# Patient Record
Sex: Female | Born: 1990 | Hispanic: No | Marital: Single | Smoking: Never smoker
Health system: Southern US, Community
[De-identification: ages and names within clinical notes are randomized; demographics above are authoritative.]

## PROBLEM LIST (undated history)

## (undated) ENCOUNTER — Inpatient Hospital Stay (HOSPITAL_COMMUNITY): Payer: Self-pay

## (undated) DIAGNOSIS — Z789 Other specified health status: Secondary | ICD-10-CM

## (undated) DIAGNOSIS — E669 Obesity, unspecified: Secondary | ICD-10-CM

## (undated) HISTORY — PX: NO PAST SURGERIES: SHX2092

---

## 2014-12-25 ENCOUNTER — Emergency Department (HOSPITAL_COMMUNITY)
Admission: EM | Admit: 2014-12-25 | Discharge: 2014-12-25 | Disposition: A | Discharge status: Home / Self Care | Payer: 59 | Attending: Emergency Medicine | Admitting: Emergency Medicine

## 2014-12-25 ENCOUNTER — Emergency Department (HOSPITAL_COMMUNITY): Payer: 59

## 2014-12-25 ENCOUNTER — Encounter (HOSPITAL_COMMUNITY): Payer: Self-pay | Admitting: Emergency Medicine

## 2014-12-25 DIAGNOSIS — L089 Local infection of the skin and subcutaneous tissue, unspecified: Secondary | ICD-10-CM | POA: Diagnosis not present

## 2014-12-25 DIAGNOSIS — Y9289 Other specified places as the place of occurrence of the external cause: Secondary | ICD-10-CM | POA: Insufficient documentation

## 2014-12-25 DIAGNOSIS — Y9389 Activity, other specified: Secondary | ICD-10-CM | POA: Insufficient documentation

## 2014-12-25 DIAGNOSIS — W228XXA Striking against or struck by other objects, initial encounter: Secondary | ICD-10-CM | POA: Insufficient documentation

## 2014-12-25 DIAGNOSIS — Y998 Other external cause status: Secondary | ICD-10-CM | POA: Diagnosis not present

## 2014-12-25 DIAGNOSIS — Z23 Encounter for immunization: Secondary | ICD-10-CM | POA: Diagnosis not present

## 2014-12-25 DIAGNOSIS — S90451A Superficial foreign body, right great toe, initial encounter: Secondary | ICD-10-CM | POA: Insufficient documentation

## 2014-12-25 DIAGNOSIS — S99921A Unspecified injury of right foot, initial encounter: Secondary | ICD-10-CM | POA: Diagnosis present

## 2014-12-25 MED ORDER — HYDROCODONE-ACETAMINOPHEN 5-325 MG PO TABS: 1.0000 | ORAL_TABLET | Freq: Four times a day (QID) | ORAL | Status: DC | PRN

## 2014-12-25 MED ORDER — LIDOCAINE HCL 2 % IJ SOLN
20.0000 mL | Freq: Once | INTRAMUSCULAR | Status: AC
  Administered 2014-12-25: 400 mg via INTRADERMAL
  Filled 2014-12-25: qty 20

## 2014-12-25 MED ORDER — CEPHALEXIN 250 MG PO CAPS
1000.0000 mg | ORAL_CAPSULE | Freq: Once | ORAL | Status: AC
  Administered 2014-12-25: 1000 mg via ORAL
  Filled 2014-12-25: qty 4

## 2014-12-25 MED ORDER — CEPHALEXIN 500 MG PO CAPS: 1000.0000 mg | ORAL_CAPSULE | Freq: Two times a day (BID) | ORAL | Status: DC

## 2014-12-25 MED ORDER — TETANUS-DIPHTH-ACELL PERTUSSIS 5-2.5-18.5 LF-MCG/0.5 IM SUSP
0.5000 mL | Freq: Once | INTRAMUSCULAR | Status: AC
  Administered 2014-12-25: 0.5 mL via INTRAMUSCULAR
  Filled 2014-12-25: qty 0.5

## 2014-12-25 NOTE — ED Provider Notes (Signed)
CSN: [id number]     Arrival date & time 12/25/14  1158 History   First MD Initiated Contact with Patient 12/25/14 1323     Chief Complaint  Patient presents with  . Foot Injury     (Consider location/radiation/quality/duration/timing/severity/associated sxs/prior Treatment) HPI   24 year old female presents for evaluation of right foot injury. Patient reported she moved into a new apartment near week ago, certainly kicked her R great toe against a hard object in which her toe was bent forward. Since then she has endorsed sharp throbbing, non radiating 8 out of 10 pain to her great toe. She has been taking ibuprofen with some improvement. She was unable to come for evaluation due to starting a new job and can't miss any days. She denies any other injury. She denies any numbness.  History reviewed. No pertinent past medical history. No past surgical history on file. History reviewed. No pertinent family history. Social History  Substance Use Topics  . Smoking status: None  . Smokeless tobacco: None  . Alcohol Use: None   OB History    No data available     Review of Systems  Constitutional: Negative for fever.  Musculoskeletal: Positive for arthralgias.  Neurological: Negative for numbness.      Allergies  Review of patient's allergies indicates no known allergies.  Home Medications   Prior to Admission medications   Not on File   BP 129/75 mmHg  Pulse 93  Temp(Src) 98.2 F (36.8 C) (Oral)  Resp 18  Ht  (1.702 m)  Wt 230 lb (104.327 kg)  BMI 36.01 kg/m2  SpO2 97%  LMP 12/21/2014 Physical Exam  Constitutional: She appears well-developed and well-nourished. No distress.  HENT:  Head: Atraumatic.  Eyes: Conjunctivae are normal.  Neck: Neck supple.  Musculoskeletal: She exhibits tenderness (Right foot: Tenderness along the entire great toe on palpation. No bruising, swelling, or deformity noted. No nail involvement. Brisk cap refill.).  Neurological: She is  alert.  Skin: No rash noted.  Psychiatric: She has a normal mood and affect.  Nursing note and vitals reviewed.   ED Course  FOREIGN BODY REMOVAL Date/Time: 12/25/2014 3:12 PM Performed by: Fayrene Helper Authorized by: Fayrene Helper Consent: Verbal consent obtained. Risks and benefits: risks, benefits and alternatives were discussed Consent given by: patient Patient understanding: patient states understanding of the procedure being performed Patient consent: the patient's understanding of the procedure matches consent given Procedure consent: procedure consent matches procedure scheduled Imaging studies: imaging studies available Required items: required blood products, implants, devices, and special equipment available Patient identity confirmed: verbally with patient and arm band Time out: Immediately prior to procedure a "time out" was called to verify the correct patient, procedure, equipment, support staff and site/side marked as required. Intake: R great toe. Anesthesia: digital block Local anesthetic: lidocaine 2% without epinephrine Anesthetic total: 8 ml Patient sedated: no Patient restrained: no Patient cooperative: yes Complexity: complex 1 objects recovered. Objects recovered: metal needle 19mm Post-procedure assessment: foreign body removed Patient tolerance: Patient tolerated the procedure well with no immediate complications Comments: R great toe was digitally nerve blocked.  A 5mm incision made to medial pad of toe and advance approximately 1cm, meeting resistance of the fb.  A sterile hemostat as used to advance toward the fb needle and needle was manually extracted.  Pustular discharge along with dark blood were noted.  Wound was thoroughly irrigated and packing placed.  Dressing applied.       (including critical care time)  Mechanical injury of right great toe. X-ray obtained.  2:26 PM Xray demonstrates a 19mm fb noted embedded in R great toe consistent with a  needle.  I discuss this finding with Dr. Rosalia Hammers who recommend attempt removal.  If unsuccessful, then request ortho for further management.  Pt agrees with plan.   3:11 PM sucessfully removal of fb in R great toe by me.  Pt will be placed on abx, pain meds, and close f/u for wound recheck     Labs Review Labs Reviewed - No data to display  Imaging Review Dg Foot Complete Right  12/25/2014   CLINICAL DATA:  24 year old female with a history of pain and swelling right great toe.  EXAM: RIGHT FOOT COMPLETE - 3+ VIEW  COMPARISON:  None.  FINDINGS: Thin radiopaque foreign body projects within the right great toe measuring 19 mm. The foreign body projects over the distal phalanx on 3 projections, including the lateral view where it overlaps the plantar cortex of the distal phalanx.  No degenerative changes of the foot.  No displaced fracture.  Mild swelling of the great toe.  IMPRESSION: Radiopaque foreign body measuring 19 mm within the right great toe. The foreign body projects over the distal phalanx on all 3 projections, and bony penetration cannot be excluded on these views.  Signed,  Yvone Neu. Loreta Ave, DO  [facility]  [facility]   Electronically Signed   By: Gilmer Mor D.O.   On: 12/25/2014 13:40   I have personally reviewed and evaluated these images and lab results as part of my medical decision-making.   EKG Interpretation None      MDM   Final diagnoses:  Foreign body of great toe, right, infected, initial encounter    BP 129/75 mmHg  Pulse 93  Temp(Src) 98.2 F (36.8 C) (Oral)  Resp 18  Ht  (1.702 m)  Wt 230 lb (104.327 kg)  BMI 36.01 kg/m2  SpO2 97%  LMP 12/21/2014     Fayrene Helper, PA-C 12/25/14 1519  Margarita Grizzle, MD 01/03/15 1345

## 2014-12-25 NOTE — ED Notes (Signed)
Hit left foot on furniture while moving 2 days ago. Endorses pain to Right great toe. NAD

## 2014-12-25 NOTE — ED Notes (Signed)
Declined W/C at D/C and was escorted to lobby by RN. 

## 2014-12-25 NOTE — Discharge Instructions (Signed)
Take antibiotic as prescribed for the full duration.  Soak toe in water with dial antibacterial soap several times daily.  Packing can be remove in 2 days.  If worsening pain, redness, or increase pus drainage then return to ER for further care. Sliver Removal You have had a sliver (splinter) removed. This has caused a wound that extends through some or all layers of the skin and possibly into the subcutaneous tissue. This is the tissue just beneath the skin. Because these wounds can not be cleaned well, it is necessary to watch closely for infection. AFTER THE PROCEDURE  If a cut (incision) was necessary to remove this, it may have been repaired for you by your caregiver either with suturing, stapling, or adhesive strips. These keep together the skin edges and allow better and faster healing. HOME CARE INSTRUCTIONS   A dressing may have been applied. This may be changed once per day or as instructed. If the dressing sticks, it may be soaked off with a gauze pad or clean cloth that has been dampened with soapy water or hydrogen peroxide.  It is difficult to remove all slivers or foreign bodies as they may break or splinter into smaller pieces. Be aware that your body will work to remove the foreign substance. That is, the foreign body may work itself out of the wound. That is normal.  Watch for signs of infection and notify your caregiver if you suspect a sliver or foreign body remains in the wound.  You may have received a recommendation to follow up with your physician or a specialist. It is very important to call for or keep follow-up appointments in order to avoid infection or other complications.  Only take over-the-counter or prescription medicines for pain, discomfort, or fever as directed by your caregiver.  If antibiotics were prescribed, be sure to finish all of the medicine. If you did not receive a tetanus shot today because you did not recall when your last one was given, check with  your caregiver in the next day or two during follow up to determine if one is needed. SEEK MEDICAL CARE IF:   The area around the wound has new or worsening redness or tenderness.  Pus is coming from the wound  There is a foul smell from the wound or dressing  The edges of a wound that had been repaired break open SEEK IMMEDIATE MEDICAL CARE IF:   Red streaks are coming from the wound  An unexplained oral temperature above 102 F (38.9 C) develops. Document Released: 03/22/2000 Document Revised: 06/17/2011 Document Reviewed: 11/09/2007 [facility] Patient Information 2015 [place], [place]. This information is not intended to replace advice given to you by your health care provider. Make sure you discuss any questions you have with your health care provider.

## 2015-02-19 ENCOUNTER — Encounter (HOSPITAL_COMMUNITY): Payer: Self-pay

## 2015-02-19 ENCOUNTER — Inpatient Hospital Stay (HOSPITAL_COMMUNITY)
Admission: AD | Admit: 2015-02-19 | Discharge: 2015-02-19 | Disposition: A | Discharge status: Home / Self Care | Payer: 59 | Source: Ambulatory Visit | Attending: Obstetrics & Gynecology | Admitting: Obstetrics & Gynecology

## 2015-02-19 DIAGNOSIS — N912 Amenorrhea, unspecified: Secondary | ICD-10-CM | POA: Diagnosis not present

## 2015-02-19 DIAGNOSIS — N939 Abnormal uterine and vaginal bleeding, unspecified: Secondary | ICD-10-CM | POA: Diagnosis present

## 2015-02-19 DIAGNOSIS — Z3202 Encounter for pregnancy test, result negative: Secondary | ICD-10-CM | POA: Diagnosis not present

## 2015-02-19 HISTORY — DX: Other specified health status: Z78.9

## 2015-02-19 LAB — URINE MICROSCOPIC-ADD ON

## 2015-02-19 LAB — URINALYSIS, ROUTINE W REFLEX MICROSCOPIC
BILIRUBIN URINE: NEGATIVE
Glucose, UA: NEGATIVE mg/dL
KETONES UR: NEGATIVE mg/dL
LEUKOCYTES UA: NEGATIVE
NITRITE: NEGATIVE
PROTEIN: NEGATIVE mg/dL
Specific Gravity, Urine: 1.015 (ref 1.005–1.030)
UROBILINOGEN UA: 0.2 mg/dL (ref 0.0–1.0)
pH: 6 (ref 5.0–8.0)

## 2015-02-19 LAB — POCT PREGNANCY, URINE: PREG TEST UR: NEGATIVE

## 2015-02-19 LAB — HCG, QUANTITATIVE, PREGNANCY

## 2015-02-19 MED ORDER — ACETAMINOPHEN-CODEINE #3 300-30 MG PO TABS
1.0000 | ORAL_TABLET | Freq: Once | ORAL | Status: AC
  Administered 2015-02-19: 1 via ORAL
  Filled 2015-02-19: qty 1

## 2015-02-19 NOTE — MAU Note (Signed)
Positive UPT this past week having cramping now.

## 2015-02-19 NOTE — Discharge Instructions (Signed)
°Menstruation °Menstruation is the monthly passing of blood, tissue, fluid, and mucus. It is also known as a period. Your body is shedding the lining of the uterus. The flow of blood usually occurs during 3-7 consecutive days each month. Hormones control the menstrual cycle. Hormones are a chemical substance produced by endocrine glands in the body to regulate different bodily functions. °The first menstrual period may start any time between age 25 years to 16 years. However, it usually starts around age 12 years. Some girls have regular monthly menstrual cycles right from the beginning. However, it is not unusual to have only a couple of drops of blood or spotting when you first start menstruating. It is also not unusual to have two periods a month or miss a month or two when first starting your periods. °SYMPTOMS  °· Mild to moderate abdominal cramps. °· Aching or pain in the lower back area. °Symptoms may occur 5-10 days before your menstrual period starts. These symptoms are referred to as premenstrual syndrome (PMS). These symptoms can include: °· Headache. °· Breast tenderness and swelling. °· Bloating. °· Tiredness (fatigue). °· Mood changes. °· Craving for certain foods. °These are normal signs and symptoms and can vary in severity. To help relieve these problems, ask your caregiver if you can take over-the-counter medications for pain or discomfort. If the symptoms are not controllable, see your caregiver for help.  °HORMONES INVOLVED IN MENSTRUATION °Menstruation comes about because of hormones produced by the pituitary gland in the brain and the ovaries that affect the uterine lining. °First, the pituitary gland in the brain produces the hormone follicle stimulating hormone (FSH). FSH stimulates the ovaries to produce estrogen, which thickens the uterine lining and begins to develop an egg in the ovary. About 14 days later, the pituitary gland produces another hormone called luteinizing hormone (LH). LH  causes the egg to come out of a sac in the ovary (ovulation). The empty sac on the ovary called the corpus luteum is stimulated by another hormone from the pituitary gland called luteotropin. The corpus luteum begins to produce the estrogen and progesterone hormone. The progesterone hormone prepares the lining of the uterus to have the fertilized egg (egg combined with sperm) attach to the lining of the uterus and begin to develop into a fetus. If the egg is not fertilized, the corpus luteum stops producing estrogen and progesterone, it disappears, the lining of the uterus sloughs off and a menstrual period begins. Then the menstrual cycle starts all over again and will continue monthly unless pregnancy occurs or menopause begins. °The secretion of hormones is complex. Various parts of the body become involved in many chemical activities. Female sex hormones have other functions in a woman's body as well. Estrogen increases a woman's sex drive (libido). It naturally helps body get rid of fluids (diuretic). It also aids in the process of building new bone. Therefore, maintaining hormonal health is essential to all levels of a woman's well being. These hormones are usually present in normal amounts and cause you to menstruate. It is the relationship between the (small) levels of the hormones that is critical. When the balance is upset, menstrual irregularities can occur. °HOW DOES THE MENSTRUAL CYCLE HAPPEN? °· Menstrual cycles vary in length from 21-35 days with an average of 29 days. The cycle begins on the first day of bleeding. At this time, the pituitary gland in the brain releases FSH that travels through the bloodstream to the ovaries. The FSH stimulates the follicles in   the ovaries. This prepares the body for ovulation that occurs around the 14th day of the cycle. The ovaries produce estrogen, and this makes sure conditions are right in the uterus for implantation of the fertilized egg. °· When the levels of  estrogen reach a high enough level, it signals the gland in the brain (pituitary gland) to release a surge of LH. This causes the release of the ripest egg from its follicle (ovulation). Usually only one follicle releases one egg, but sometimes more than one follicle releases an egg especially when stimulating the ovaries for in vitro fertilization. The egg can then be collected by either fallopian tube to await fertilization. The burst follicle within the ovary that is left behind is now called the corpus luteum or "yellow body." The corpus luteum continues to give off (secrete) reduced amounts of estrogen. This closes and hardens the cervix. It dries up the mucus to the naturally infertile condition. °· The corpus luteum also begins to give off greater amounts of progesterone. This causes the lining of the uterus (endometrium) to thicken even more in preparation for the fertilized egg. The egg is starting to journey down from the fallopian tube to the uterus. It also signals the ovaries to stop releasing eggs. It assists in returning the cervical mucus to its infertile state. °· If the egg implants successfully into the womb lining and pregnancy occurs, progesterone levels will continue to raise. It is often this hormone that gives some pregnant women a feeling of well being, like a "natural high." Progesterone levels drop again after childbirth. °· If fertilization does not occur, the corpus luteum dies, stopping the production of hormones. This sudden drop in progesterone causes the uterine lining to break down, accompanied by blood (menstruation). °· This starts the cycle back at day 1. The whole process starts all over again. Woman go through this cycle every month from puberty to menopause. Women have breaks only for pregnancy and breastfeeding (lactation), unless the woman has health problems that affect the female hormone system or chooses to use oral contraceptives to have unnatural menstrual  periods. °HOME CARE INSTRUCTIONS  °· Keep track of your periods by using a calendar. °· If you use tampons, get the least absorbent to avoid toxic shock syndrome. °· Do not leave tampons in the vagina over night or longer than 6 hours. °· Wear a sanitary pad over night. °· Exercise 3-5 times a week or more. °· Avoid foods and drinks that you know will make your symptoms worse before or during your period. °SEEK MEDICAL CARE IF:  °· You develop a fever with your period. °· Your periods are lasting more than 7 days. °· Your period is so heavy that you have to change pads or tampons every 30 minutes. °· You develop clots with your period and never had clots before. °· You cannot get relief from over-the-counter medication for your symptoms. °· Your period has not started, and it has been longer than 35 days. °  °This information is not intended to replace advice given to you by your health care provider. Make sure you discuss any questions you have with your health care provider. °  °Document Released: 03/15/2002 Document Revised: 12/14/2014 Document Reviewed: 10/22/2012 °Elsevier Interactive Patient Education ©2016 [employer] ° ° °

## 2015-02-19 NOTE — MAU Note (Signed)
Vaginal bleeding started earlier was a lot but now tapered off having lower abdominal cramping.

## 2015-02-19 NOTE — Progress Notes (Signed)
Patient unsure of LMP, thinks in September, had positive UPT on 11/10, our UPT today is negative.

## 2015-02-19 NOTE — MAU Provider Note (Signed)
History     CSN: [id number]  Arrival date and time: 02/19/15 1715   First Provider Initiated Contact with Patient 02/19/15 1756      Chief Complaint  Patient presents with  . Abdominal Cramping  . Vaginal Bleeding   HPI Olivia Griffith is a 24 y.o. G2P1011 with positive HPT 4 days ago and LMP in September presents with vaginal bleeding and abdominal pain. She states she started small amount red vaginal bleeding yesterday and today has spotting and suprapubic cramping. Pregnancy is desired.  . OB History  Gravida Para Term Preterm AB SAB TAB Ectopic Multiple Living  # Outcome Date GA Lbr Len/2nd Weight Sex Delivery Anes PTL Lv  2 Term           1 TAB                Past Medical History  Diagnosis Date  . Medical history non-contributory     Past Surgical History  Procedure Laterality Date  . No past surgeries      No family history on file.  Social History  Substance Use Topics  . Smoking status: Never Smoker   . Smokeless tobacco: Not on file  . Alcohol Use: No    Allergies:  Allergies  Allergen Reactions  . Demerol [Meperidine] Anaphylaxis    Prescriptions prior to admission  Medication Sig Dispense Refill Last Dose  . Prenatal Vit-Fe Fumarate-FA (PRENATAL MULTIVITAMIN) TABS tablet Take 1 tablet by mouth daily at 12 noon.   02/19/2015 at Unknown time  . cephALEXin (KEFLEX) 500 MG capsule Take 2 capsules (1,000 mg total) by mouth 2 (two) times daily. (Patient not taking: Reported on 02/19/2015) 40 capsule 0 Completed Course at Unknown time  . HYDROcodone-acetaminophen (NORCO/VICODIN) 5-325 MG per tablet Take 1 tablet by mouth every 6 (six) hours as needed for moderate pain. (Patient not taking: Reported on 02/19/2015) 15 tablet 0 Completed Course at Unknown time    ROS Physical Exam   Blood pressure 120/86, pulse 102, resp. rate 18.  Physical Exam  Constitutional: She is oriented to person, place, and time. She appears  well-developed and well-nourished.  HENT:  Head: Normocephalic.  Eyes: Pupils are equal, round, and reactive to light.  Neck: Normal range of motion. Neck supple.  Cardiovascular: Normal rate, regular rhythm and normal heart sounds.   Respiratory: Effort normal and breath sounds normal.  GI: Soft. She exhibits no mass. There is no guarding.  Genitourinary: Right adnexum displays no tenderness. Left adnexum displays no tenderness.  Neurological: She is alert and oriented to person, place, and time. She has normal reflexes.  Skin: Skin is warm and dry.    MAU Course  Procedures Results for orders placed or performed during the hospital encounter of 02/19/15 (from the past 24 hour(s))  Pregnancy, urine POC     Status: None   Collection Time: 02/19/15  5:48 PM  Result Value Ref Range   Preg Test, Ur NEGATIVE NEGATIVE   MDM Care assumed by Rochele Pages CNM at 1850  1945 Pt reassessed; reports midpelvic pain, no report of abnormal vaginal discharge.  Results for orders placed or performed during the hospital encounter of 02/19/15 (from the past 24 hour(s))  Urinalysis, Routine w reflex microscopic (not at [facility])     Status: Abnormal   Collection Time: 02/19/15  5:40 PM  Result Value Ref Range   Color, Urine YELLOW YELLOW  APPearance CLEAR CLEAR   Specific Gravity, Urine 1.015 1.005 - 1.030   pH 6.0 5.0 - 8.0   Glucose, UA NEGATIVE NEGATIVE mg/dL   Hgb urine dipstick SMALL (A) NEGATIVE   Bilirubin Urine NEGATIVE NEGATIVE   Ketones, ur NEGATIVE NEGATIVE mg/dL   Protein, ur NEGATIVE NEGATIVE mg/dL   Urobilinogen, UA 0.2 0.0 - 1.0 mg/dL   Nitrite NEGATIVE NEGATIVE   Leukocytes, UA NEGATIVE NEGATIVE  Urine microscopic-add on     Status: Abnormal   Collection Time: 02/19/15  5:40 PM  Result Value Ref Range   Squamous Epithelial / LPF FEW (A) RARE   WBC, UA 0-2 <3 WBC/hpf   RBC / HPF 0-2 <3 RBC/hpf   Bacteria, UA RARE RARE  Pregnancy, urine POC     Status: None   Collection  Time: 02/19/15  5:48 PM  Result Value Ref Range   Preg Test, Ur NEGATIVE NEGATIVE  hCG, quantitative, pregnancy     Status: None   Collection Time: 02/19/15  6:10 PM  Result Value Ref Range   hCG, Beta Chain, Quant, S <1 <5 mIU/mL     Assessment and Plan  Amenorrhea - Menses likely starting  Plan: Discharge to home Pt declined RX for ibuprofen Follow-up prn  Marlis EdelsonWalidah N Karim, CNM

## 2015-09-09 ENCOUNTER — Emergency Department (HOSPITAL_COMMUNITY)
Admission: EM | Admit: 2015-09-09 | Discharge: 2015-09-09 | Disposition: A | Discharge status: Home / Self Care | Payer: No Typology Code available for payment source | Attending: Emergency Medicine | Admitting: Emergency Medicine

## 2015-09-09 ENCOUNTER — Encounter (HOSPITAL_COMMUNITY): Payer: Self-pay

## 2015-09-09 DIAGNOSIS — S161XXA Strain of muscle, fascia and tendon at neck level, initial encounter: Secondary | ICD-10-CM | POA: Diagnosis not present

## 2015-09-09 DIAGNOSIS — Z79899 Other long term (current) drug therapy: Secondary | ICD-10-CM | POA: Diagnosis not present

## 2015-09-09 DIAGNOSIS — S299XXA Unspecified injury of thorax, initial encounter: Secondary | ICD-10-CM | POA: Diagnosis present

## 2015-09-09 DIAGNOSIS — Y92410 Unspecified street and highway as the place of occurrence of the external cause: Secondary | ICD-10-CM | POA: Insufficient documentation

## 2015-09-09 DIAGNOSIS — Y999 Unspecified external cause status: Secondary | ICD-10-CM | POA: Insufficient documentation

## 2015-09-09 DIAGNOSIS — Y939 Activity, unspecified: Secondary | ICD-10-CM | POA: Diagnosis not present

## 2015-09-09 DIAGNOSIS — M546 Pain in thoracic spine: Secondary | ICD-10-CM | POA: Insufficient documentation

## 2015-09-09 MED ORDER — CYCLOBENZAPRINE HCL 10 MG PO TABS: 10.0000 mg | ORAL_TABLET | Freq: Every day | ORAL | Status: DC
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
## 2015-09-09 NOTE — ED Notes (Signed)
Pt in gown.

## 2015-12-15 LAB — OB RESULTS CONSOLE GC/CHLAMYDIA
Chlamydia: NEGATIVE
Gonorrhea: NEGATIVE

## 2015-12-15 LAB — OB RESULTS CONSOLE ABO/RH: RH TYPE: NEGATIVE

## 2015-12-15 LAB — OB RESULTS CONSOLE HIV ANTIBODY (ROUTINE TESTING): HIV: NONREACTIVE

## 2015-12-15 LAB — OB RESULTS CONSOLE RUBELLA ANTIBODY, IGM: RUBELLA: IMMUNE

## 2015-12-15 LAB — OB RESULTS CONSOLE HEPATITIS B SURFACE ANTIGEN: Hepatitis B Surface Ag: NEGATIVE

## 2015-12-15 LAB — OB RESULTS CONSOLE RPR: RPR: NONREACTIVE

## 2015-12-27 ENCOUNTER — Inpatient Hospital Stay (HOSPITAL_COMMUNITY): Admission: AD | Admit: 2015-12-27 | Payer: 59 | Source: Ambulatory Visit | Admitting: Obstetrics and Gynecology
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
## 2016-04-14 ENCOUNTER — Inpatient Hospital Stay (HOSPITAL_COMMUNITY)
Admission: AD | Admit: 2016-04-14 | Discharge: 2016-04-14 | Disposition: A | Discharge status: Home / Self Care | Payer: 59 | Source: Ambulatory Visit | Attending: Obstetrics and Gynecology | Admitting: Obstetrics and Gynecology

## 2016-04-14 ENCOUNTER — Encounter (HOSPITAL_COMMUNITY): Payer: Self-pay | Admitting: *Deleted

## 2016-04-14 DIAGNOSIS — O9989 Other specified diseases and conditions complicating pregnancy, childbirth and the puerperium: Secondary | ICD-10-CM | POA: Insufficient documentation

## 2016-04-14 DIAGNOSIS — Z885 Allergy status to narcotic agent status: Secondary | ICD-10-CM | POA: Insufficient documentation

## 2016-04-14 DIAGNOSIS — O26892 Other specified pregnancy related conditions, second trimester: Secondary | ICD-10-CM | POA: Diagnosis not present

## 2016-04-14 DIAGNOSIS — Z3A26 26 weeks gestation of pregnancy: Secondary | ICD-10-CM | POA: Insufficient documentation

## 2016-04-14 DIAGNOSIS — M62830 Muscle spasm of back: Secondary | ICD-10-CM | POA: Diagnosis not present

## 2016-04-14 DIAGNOSIS — M62838 Other muscle spasm: Secondary | ICD-10-CM | POA: Diagnosis not present

## 2016-04-14 DIAGNOSIS — N898 Other specified noninflammatory disorders of vagina: Secondary | ICD-10-CM | POA: Diagnosis present

## 2016-04-14 LAB — URINALYSIS, ROUTINE W REFLEX MICROSCOPIC
BILIRUBIN URINE: NEGATIVE
Glucose, UA: NEGATIVE mg/dL
Hgb urine dipstick: NEGATIVE
Ketones, ur: NEGATIVE mg/dL
Nitrite: NEGATIVE
PH: 7 (ref 5.0–8.0)
Protein, ur: NEGATIVE mg/dL
Specific Gravity, Urine: 1.009 (ref 1.005–1.030)

## 2016-04-14 LAB — WET PREP, GENITAL
Clue Cells Wet Prep HPF POC: NONE SEEN
SPERM: NONE SEEN
TRICH WET PREP: NONE SEEN
YEAST WET PREP: NONE SEEN

## 2016-04-14 LAB — AMNISURE RUPTURE OF MEMBRANE (ROM) NOT AT ARMC: Amnisure ROM: NEGATIVE

## 2016-04-14 MED ORDER — CYCLOBENZAPRINE HCL 10 MG PO TABS: 10.0000 mg | ORAL_TABLET | Freq: Two times a day (BID) | ORAL | 0 refills | Status: DC | PRN

## 2016-04-14 NOTE — Discharge Instructions (Signed)
Keep your appointments in the office.  Call and schedule an additional appointment if you are not doing well. Use ice to your back for 15 minutes at a time Get medication at your pharmacy and take as directed. Take Tylenol 325 mg 2 tablets by mouth every 4 hours if needed for pain. Drink at least 8 8-oz glasses of water every day. Return if you have abdominal pain or a gush of fluid.

## 2016-04-14 NOTE — MAU Note (Signed)
Pt presents to MAU with complaints of lower back pain with leakage of clear fluid that started around 8 this morning. Denies vaginal bleeding

## 2016-04-14 NOTE — MAU Provider Note (Signed)
History     CSN: [id number]  Arrival date and time: 04/14/16 1018   First Provider Initiated Contact with Patient 04/14/16 1100      Chief Complaint  Patient presents with  . Vaginal Discharge  . Back Pain   HPI Olivia Griffith 26 y.o. [redacted]w[redacted]d  Comes to MAU with back pain from mid back to lower back and leaking of fluid.  Wore a pad in which was damp.  She has not had cramping or bleeding.  OB History    Gravida Para Term Preterm AB Living   3 1 1   1 1    SAB TAB Ectopic Multiple Live Births     1            Past Medical History:  Diagnosis Date  . Medical history non-contributory     Past Surgical History:  Procedure Laterality Date  . NO PAST SURGERIES      No family history on file.  Social History  Substance Use Topics  . Smoking status: Never Smoker  . Smokeless tobacco: Never Used  . Alcohol use No    Allergies:  Allergies  Allergen Reactions  . Demerol [Meperidine] Anaphylaxis    Prescriptions Prior to Admission  Medication Sig Dispense Refill Last Dose  . Prenatal Vit-Fe Fumarate-FA (PRENATAL MULTIVITAMIN) TABS tablet Take 1 tablet by mouth daily at 12 noon.    04/13/2016 at Unknown time    Review of Systems  Constitutional: Negative for fever.  Gastrointestinal: Negative for abdominal pain, diarrhea and nausea.  Genitourinary: Positive for vaginal discharge. Negative for dysuria, frequency, vaginal bleeding and vaginal pain.  Musculoskeletal: Positive for back pain.   Physical Exam   Blood pressure 139/67, pulse 102, temperature 97.8 F (36.6 C), resp. rate 18, last menstrual period 10/11/2015.  Physical Exam  Nursing note and vitals reviewed. Constitutional: She is oriented to person, place, and time. She appears well-developed and well-nourished.  HENT:  Head: Normocephalic.  Eyes: EOM are normal.  Neck: Neck supple.  Respiratory: Effort normal.  GI: Soft. There is no tenderness.  Genitourinary:  Genitourinary Comments: Speculum  exam: Vagina - Small amount of white discharge, no odor, no pooling even with valsalva Cervix - Barely visible but no active leaking seen Bimanual exam: Cervix in os closed, presenting part is high and not in the pelvis GC/Chlam, wet prep, amnisure done Chaperone present for exam.   Musculoskeletal: Normal range of motion.  Back pain to palpation in low mid line and bilaterally from shoulder blades to pelvis.  Neurological: She is alert and oriented to person, place, and time.  Skin: Skin is warm and dry.  Psychiatric: She has a normal mood and affect.    MAU Course  Procedures Results for orders placed or performed during the hospital encounter of 04/14/16 (from the past 24 hour(s))  Urinalysis, Routine w reflex microscopic     Status: Abnormal   Collection Time: 04/14/16 10:31 AM  Result Value Ref Range   Color, Urine YELLOW YELLOW   APPearance HAZY (A) CLEAR   Specific Gravity, Urine 1.009 1.005 - 1.030   pH 7.0 5.0 - 8.0   Glucose, UA NEGATIVE NEGATIVE mg/dL   Hgb urine dipstick NEGATIVE NEGATIVE   Bilirubin Urine NEGATIVE NEGATIVE   Ketones, ur NEGATIVE NEGATIVE mg/dL   Protein, ur NEGATIVE NEGATIVE mg/dL   Nitrite NEGATIVE NEGATIVE   Leukocytes, UA SMALL (A) NEGATIVE   RBC / HPF 0-5 0 - 5 RBC/hpf   WBC, UA 0-5 0 -  5 WBC/hpf   Bacteria, UA FEW (A) NONE SEEN   Squamous Epithelial / LPF 6-30 (A) NONE SEEN   Mucous PRESENT   Amnisure rupture [facility] membrane (rom)not at [facility]     Status: None   Collection Time: 04/14/16 11:02 AM  Result Value Ref Range   Amnisure ROM NEGATIVE   Wet prep, genital     Status: Abnormal   Collection Time: 04/14/16 11:02 AM  Result Value Ref Range   Yeast Wet Prep HPF POC NONE SEEN NONE SEEN   Trich, Wet Prep NONE SEEN NONE SEEN   Clue Cells Wet Prep HPF POC NONE SEEN NONE SEEN   WBC, Wet Prep HPF POC FEW (A) NONE SEEN   Sperm NONE SEEN     MDM 1230  Consult with Dr  Jackelyn KnifeMeisinger re: plan of care  Assessment and Plan  Back muscle spasm -  likely due to vacuuming yesterday No UTI No preterm labor or ROM  Plan Eprescribed Flexeril 10 mg to her pharmacy to try to relieve back pain Also try heat or ice to her back to relieve pain. Keep your appointments in the office and call the office if your symptoms are not relieved.   Leanne Sisler L Debra Colon 04/14/2016, 12:32 PM

## 2016-04-15 LAB — GC/CHLAMYDIA PROBE AMP (~~LOC~~) NOT AT ARMC
CHLAMYDIA, DNA PROBE: NEGATIVE
NEISSERIA GONORRHEA: NEGATIVE

## 2016-04-27 ENCOUNTER — Encounter (HOSPITAL_COMMUNITY): Payer: Self-pay

## 2016-04-27 ENCOUNTER — Emergency Department (HOSPITAL_COMMUNITY)
Admission: EM | Admit: 2016-04-27 | Discharge: 2016-04-27 | Disposition: A | Discharge status: Home / Self Care | Payer: 59 | Attending: Emergency Medicine | Admitting: Emergency Medicine

## 2016-04-27 DIAGNOSIS — Y929 Unspecified place or not applicable: Secondary | ICD-10-CM | POA: Diagnosis not present

## 2016-04-27 DIAGNOSIS — Y999 Unspecified external cause status: Secondary | ICD-10-CM | POA: Diagnosis not present

## 2016-04-27 DIAGNOSIS — Y939 Activity, unspecified: Secondary | ICD-10-CM | POA: Insufficient documentation

## 2016-04-27 DIAGNOSIS — O9A313 Physical abuse complicating pregnancy, third trimester: Secondary | ICD-10-CM | POA: Diagnosis present

## 2016-04-27 DIAGNOSIS — S20211A Contusion of right front wall of thorax, initial encounter: Secondary | ICD-10-CM | POA: Diagnosis not present

## 2016-04-27 DIAGNOSIS — Z3A29 29 weeks gestation of pregnancy: Secondary | ICD-10-CM | POA: Diagnosis not present

## 2016-04-27 DIAGNOSIS — S298XXA Other specified injuries of thorax, initial encounter: Secondary | ICD-10-CM
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
## 2016-04-27 NOTE — ED Notes (Signed)
OB RR at bedside 
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
## 2016-04-27 NOTE — ED Notes (Signed)
RR OB RN Mary contacted and in route to cone

## 2016-04-27 NOTE — Discharge Instructions (Signed)
Follow-up with her GYN.  Use ice over the area that is sore.  Tylenol for pain.  Return here as needed for any worsening in your condition [facility]

## 2016-04-29 ENCOUNTER — Encounter (HOSPITAL_COMMUNITY): Payer: Self-pay

## 2016-04-29 ENCOUNTER — Encounter (HOSPITAL_COMMUNITY): Payer: Self-pay | Admitting: *Deleted

## 2016-06-11 ENCOUNTER — Encounter (HOSPITAL_COMMUNITY): Payer: Self-pay | Admitting: *Deleted

## 2016-06-11 ENCOUNTER — Inpatient Hospital Stay (HOSPITAL_COMMUNITY)
Admission: AD | Admit: 2016-06-11 | Discharge: 2016-06-11 | Disposition: A | Discharge status: Home / Self Care | Payer: 59 | Source: Ambulatory Visit | Attending: Obstetrics and Gynecology | Admitting: Obstetrics and Gynecology

## 2016-06-11 DIAGNOSIS — Z3689 Encounter for other specified antenatal screening: Secondary | ICD-10-CM

## 2016-06-11 DIAGNOSIS — O9989 Other specified diseases and conditions complicating pregnancy, childbirth and the puerperium: Secondary | ICD-10-CM | POA: Diagnosis not present

## 2016-06-11 DIAGNOSIS — O26893 Other specified pregnancy related conditions, third trimester: Secondary | ICD-10-CM | POA: Insufficient documentation

## 2016-06-11 DIAGNOSIS — N898 Other specified noninflammatory disorders of vagina: Secondary | ICD-10-CM | POA: Insufficient documentation

## 2016-06-11 DIAGNOSIS — Z3A35 35 weeks gestation of pregnancy: Secondary | ICD-10-CM | POA: Insufficient documentation

## 2016-06-11 HISTORY — DX: Obesity, unspecified: E66.9

## 2016-06-11 LAB — WET PREP, GENITAL
Clue Cells Wet Prep HPF POC: NONE SEEN
SPERM: NONE SEEN
TRICH WET PREP: NONE SEEN
YEAST WET PREP: NONE SEEN

## 2016-06-11 LAB — URINALYSIS, ROUTINE W REFLEX MICROSCOPIC
BILIRUBIN URINE: NEGATIVE
Glucose, UA: NEGATIVE mg/dL
Hgb urine dipstick: NEGATIVE
Ketones, ur: NEGATIVE mg/dL
NITRITE: NEGATIVE
PH: 7 (ref 5.0–8.0)
Protein, ur: NEGATIVE mg/dL
SPECIFIC GRAVITY, URINE: 1.006 (ref 1.005–1.030)

## 2016-06-11 LAB — AMNISURE RUPTURE OF MEMBRANE (ROM) NOT AT ARMC: AMNISURE: NEGATIVE
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
## 2016-06-11 NOTE — Discharge Instructions (Signed)

## 2016-06-11 NOTE — MAU Note (Signed)
C/o ?SROM @ 1100 today; c/o ucs since 1130;

## 2016-06-13 LAB — OB RESULTS CONSOLE GBS: STREP GROUP B AG: POSITIVE

## 2016-06-29 ENCOUNTER — Inpatient Hospital Stay (HOSPITAL_COMMUNITY)
Admission: AD | Admit: 2016-06-29 | Discharge: 2016-07-02 | DRG: 775 | Disposition: A | Discharge status: Home / Self Care | Payer: 59 | Source: Ambulatory Visit | Attending: Obstetrics and Gynecology | Admitting: Obstetrics and Gynecology

## 2016-06-29 ENCOUNTER — Encounter (HOSPITAL_COMMUNITY): Payer: Self-pay

## 2016-06-29 DIAGNOSIS — O99824 Streptococcus B carrier state complicating childbirth: Secondary | ICD-10-CM | POA: Diagnosis present

## 2016-06-29 DIAGNOSIS — O4290 Premature rupture of membranes, unspecified as to length of time between rupture and onset of labor, unspecified weeks of gestation: Secondary | ICD-10-CM | POA: Diagnosis present

## 2016-06-29 DIAGNOSIS — Z3A37 37 weeks gestation of pregnancy: Secondary | ICD-10-CM

## 2016-06-29 DIAGNOSIS — O4202 Full-term premature rupture of membranes, onset of labor within 24 hours of rupture: Principal | ICD-10-CM | POA: Diagnosis present

## 2016-06-29 DIAGNOSIS — O26893 Other specified pregnancy related conditions, third trimester: Secondary | ICD-10-CM | POA: Diagnosis present

## 2016-06-29 DIAGNOSIS — Z6791 Unspecified blood type, Rh negative: Secondary | ICD-10-CM

## 2016-06-29 LAB — TYPE AND SCREEN
ABO/RH(D): A NEG
ANTIBODY SCREEN: NEGATIVE

## 2016-06-29 LAB — CBC
HCT: 32.9 % — ABNORMAL LOW (ref 36.0–46.0)
HEMOGLOBIN: 10.6 g/dL — AB (ref 12.0–15.0)
MCH: 29 pg (ref 26.0–34.0)
MCHC: 32.2 g/dL (ref 30.0–36.0)
MCV: 89.9 fL (ref 78.0–100.0)
PLATELETS: 182 10*3/uL (ref 150–400)
RBC: 3.66 MIL/uL — AB (ref 3.87–5.11)
RDW: 15.4 % (ref 11.5–15.5)
WBC: 10.5 10*3/uL (ref 4.0–10.5)

## 2016-06-29 LAB — POCT FERN TEST: POCT FERN TEST: POSITIVE

## 2016-06-29 MED ORDER — PENICILLIN G POT IN DEXTROSE 60000 UNIT/ML IV SOLN
3.0000 10*6.[IU] | INTRAVENOUS | Status: DC
  Administered 2016-06-30 (×2): 3 10*6.[IU] via INTRAVENOUS
  Filled 2016-06-29 (×3): qty 50

## 2016-06-29 MED ORDER — OXYCODONE-ACETAMINOPHEN 5-325 MG PO TABS: 1.0000 | ORAL_TABLET | ORAL | Status: DC | PRN

## 2016-06-29 MED ORDER — BUTORPHANOL TARTRATE 1 MG/ML IJ SOLN: 1.0000 mg | INTRAMUSCULAR | Status: DC | PRN

## 2016-06-29 MED ORDER — ACETAMINOPHEN 325 MG PO TABS: 650.0000 mg | ORAL_TABLET | ORAL | Status: DC | PRN

## 2016-06-29 MED ORDER — LACTATED RINGERS IV SOLN
INTRAVENOUS | Status: DC
  Administered 2016-06-30 (×2): via INTRAVENOUS

## 2016-06-29 MED ORDER — SOD CITRATE-CITRIC ACID 500-334 MG/5ML PO SOLN: 30.0000 mL | ORAL | Status: DC | PRN

## 2016-06-29 MED ORDER — OXYTOCIN BOLUS FROM INFUSION
500.0000 mL | Freq: Once | INTRAVENOUS | Status: AC
  Administered 2016-06-30: 500 mL via INTRAVENOUS

## 2016-06-29 MED ORDER — TERBUTALINE SULFATE 1 MG/ML IJ SOLN
0.2500 mg | Freq: Once | INTRAMUSCULAR | Status: DC | PRN
  Filled 2016-06-29: qty 1

## 2016-06-29 MED ORDER — PENICILLIN G POTASSIUM 5000000 UNITS IJ SOLR
5.0000 10*6.[IU] | Freq: Once | INTRAVENOUS | Status: AC
  Administered 2016-06-29: 5 10*6.[IU] via INTRAVENOUS
  Filled 2016-06-29: qty 5

## 2016-06-29 MED ORDER — OXYTOCIN 40 UNITS IN LACTATED RINGERS INFUSION - SIMPLE MED: 2.5000 [IU]/h | INTRAVENOUS | Status: DC

## 2016-06-29 MED ORDER — OXYTOCIN 40 UNITS IN LACTATED RINGERS INFUSION - SIMPLE MED
1.0000 m[IU]/min | INTRAVENOUS | Status: DC
  Administered 2016-06-30: 2 m[IU]/min via INTRAVENOUS
  Filled 2016-06-29: qty 1000

## 2016-06-29 MED ORDER — ONDANSETRON HCL 4 MG/2ML IJ SOLN: 4.0000 mg | Freq: Four times a day (QID) | INTRAMUSCULAR | Status: DC | PRN

## 2016-06-29 MED ORDER — OXYCODONE-ACETAMINOPHEN 5-325 MG PO TABS: 2.0000 | ORAL_TABLET | ORAL | Status: DC | PRN

## 2016-06-29 MED ORDER — LIDOCAINE HCL (PF) 1 % IJ SOLN
30.0000 mL | INTRAMUSCULAR | Status: DC | PRN
  Filled 2016-06-29: qty 30

## 2016-06-29 MED ORDER — LACTATED RINGERS IV SOLN: 500.0000 mL | INTRAVENOUS | Status: DC | PRN

## 2016-06-29 NOTE — Treatment Plan (Signed)
Due to pt contracting q2 min, breathing thru contractions, and stating that pain has only increased in the last hour, MD Senaida Oresichardson ok with waiting to start pitocin until after rechecking sve in about an hr.

## 2016-06-29 NOTE — MAU Note (Signed)
Pt states water broke at 2100-clear fluid. Pt states she is having irregular contractions that she has not timed. +FM. Denies vag bleeding. Cervix was closed on last exam.

## 2016-06-29 NOTE — MAU Provider Note (Signed)
Chief Complaint:  Rupture of Membranes   First Provider Initiated Contact with Patient 06/29/16 2202      HPI: Olivia Griffith is a 26 y.o. G3P1011 at [redacted]w[redacted]d by LMP who presents to maternity admissions reporting a gush of clear fluid from the vagina at 2100 while standing folding laundry. She also reports irregular but uncomfortable contractions starting a couple of hours before the gush of fluid.  Her pregnancy has been uncomplicated.  Her previous delivery was a vaginal delivery at term without complication, 10 years ago.  She is GBS positive.  She reports good fetal movement, denies vaginal bleeding, vaginal itching/burning, urinary symptoms, h/a, dizziness, n/v, or fever/chills.    HPI  Past Medical History: Past Medical History:  Diagnosis Date  . Medical history non-contributory   . Obese     Past obstetric history: OB History  Gravida Para Term Preterm AB Living  3 1 1  0 1 1  SAB TAB Ectopic Multiple Live Births  0 1 0   1    # Outcome Date GA Lbr Len/2nd Weight Sex Delivery Anes PTL Lv  3 Current           2 Term 02/03/06    M Vag-Spont  N LIV  1 TAB               Past Surgical History: Past Surgical History:  Procedure Laterality Date  . NO PAST SURGERIES      Family History: No family history on file.  Social History: Social History  Substance Use Topics  . Smoking status: Never Smoker  . Smokeless tobacco: Never Used  . Alcohol use No    Allergies:  Allergies  Allergen Reactions  . Demerol [Meperidine] Anaphylaxis  . Demerol [Meperidine] Anaphylaxis    Meds:  Prescriptions Prior to Admission  Medication Sig Dispense Refill Last Dose  . Prenatal Vit-Fe Fumarate-FA (PRENATAL MULTIVITAMIN) TABS tablet Take 1 tablet by mouth daily at 12 noon.    06/29/2016 at Unknown time    ROS:  Review of Systems  Constitutional: Negative for chills, fatigue and fever.  Eyes: Negative for visual disturbance.  Respiratory: Negative for shortness of breath.    Cardiovascular: Negative for chest pain.  Gastrointestinal: Positive for abdominal pain. Negative for nausea and vomiting.  Genitourinary: Positive for vaginal discharge. Negative for difficulty urinating, dysuria, flank pain, pelvic pain, vaginal bleeding and vaginal pain.  Neurological: Negative for dizziness and headaches.  Psychiatric/Behavioral: Negative.      I have reviewed patient's Past Medical Hx, Surgical Hx, Family Hx, Social Hx, medications and allergies.   Physical Exam   Patient Vitals for the past 24 hrs:  BP Pulse Resp  06/29/16 2147 118/74 (!) 108 -  06/29/16 2136 (!) 149/85 (!) 101 17   Constitutional: Well-developed, well-nourished female in no acute distress.  Cardiovascular: normal rate Respiratory: normal effort GI: Abd soft, non-tender, gravid appropriate for gestational age.  MS: Extremities nontender, no edema, normal ROM Neurologic: Alert and oriented x 4.    Dilation: 1 Effacement (%): Thick Cervical Position: Posterior Station: -2 Presentation: Vertex Exam by:: Sharen Counter  FHT:  Baseline 145 , moderate variability, accelerations present, no decelerations Contractions: q 2 mins, mild to palpation   Labs: Results for orders placed or performed during the hospital encounter of 06/29/16 (from the past 24 hour(s))  POCT fern test     Status: Abnormal   Collection Time: 06/29/16 10:06 PM  Result Value Ref Range   POCT [place] Test  Positive = ruptured amniotic membanes       Imaging:  No results found.  MAU Course/MDM: I have ordered labs and reviewed results.  NST reviewed Consult Dr Senaida Oresichardson with presentation, exam findings and test results.  [facility] Active management of labor as needed   Assessment: 1. Full-term premature rupture of membranes with onset of labor within 24 hours of rupture     Plan: [facility] management of labor as needed    Sharen CounterLisa Leftwich-Kirby Certified  Nurse-Midwife 06/29/2016 10:36 PM

## 2016-06-29 NOTE — H&P (Signed)
Olivia Griffith is a 26 y.o. female G3P1011 at 4637 3/7 weeks (EDD 07/17/16 by LMP c/Griffith 6 week US)  presenting for SROM at 2100pm. Prenatal care complicated by maternal obesity, hemoglobin AS status,  and GBS positive status.  She is Rh negative and received rhogam.  OB History    Gravida Para Term Preterm AB Living   3 1 1  0 1 1   SAB TAB Ectopic Multiple Live Births   0 1 0   1    EAB x 1 NSVD 2007 7#8oz  Past Medical History:  Diagnosis Date  . Medical history non-contributory   . Obese    Past Surgical History:  Procedure Laterality Date  . NO PAST SURGERIES     Family History: family history is not on file. Social History:  reports that she has never smoked. She has never used smokeless tobacco. She reports that she does not drink alcohol or use drugs.     Maternal Diabetes: No Genetic Screening: Declined Maternal Ultrasounds/Referrals: Normal Fetal Ultrasounds or other Referrals:  None Maternal Substance Abuse:  No Significant Maternal Medications:  None Significant Maternal Lab Results:  Lab values include: Group B Strep positive, Rh negative Other Comments:  None  Review of Systems  Gastrointestinal: Negative for abdominal pain.   Maternal Medical History:  Reason for admission: Rupture of membranes.   Contractions: Onset was 1-2 hours ago.   Frequency: irregular.   Perceived severity is mild.    Fetal activity: Perceived fetal activity is normal.    Prenatal Complications - Diabetes: none.    Dilation: 1 Effacement (%): Thick Station: -2 Exam by:: Olivia Griffith Blood pressure 118/74, pulse (!) 108, resp. rate 17, last menstrual period 10/11/2015. Maternal Exam:  Uterine Assessment: Contraction strength is mild.  Contraction frequency is irregular.   Abdomen: Patient reports no abdominal tenderness. Fetal presentation: vertex  Introitus: Normal vulva. Normal vagina.  Ferning test: positive.  Nitrazine test: positive. Amniotic fluid character:  clear.     Physical Exam  Constitutional: She appears well-developed and well-nourished.  Cardiovascular: Normal rate and regular rhythm.   Respiratory: Effort normal.  GI: Soft.  Genitourinary: Vagina normal.  Musculoskeletal: Normal range of motion.  Psychiatric: She has a normal mood and affect.    Prenatal labs: ABO, Rh:  A neg Antibody:  neg Rubella:  Immune RPR:   NR HBsAg:   Neg HIV:   NR GBS:   positive One hour GCT 85 Hgb AS Declined genetic screening  Assessment/Plan: Pt admitted with SROM and minimal contractions,  Will augment with pitocin.  Epidural prn.    Olivia Griffith,Olivia Griffith 06/29/2016, 10:34 PM

## 2016-06-30 ENCOUNTER — Encounter (HOSPITAL_COMMUNITY): Payer: Self-pay | Admitting: Emergency Medicine

## 2016-06-30 ENCOUNTER — Inpatient Hospital Stay (HOSPITAL_COMMUNITY): Payer: 59 | Admitting: Anesthesiology

## 2016-06-30 LAB — RPR: RPR: NONREACTIVE

## 2016-06-30 LAB — ABO/RH: ABO/RH(D): A NEG

## 2016-06-30 MED ORDER — EPHEDRINE 5 MG/ML INJ
10.0000 mg | INTRAVENOUS | Status: DC | PRN
  Filled 2016-06-30: qty 2

## 2016-06-30 MED ORDER — FENTANYL 2.5 MCG/ML BUPIVACAINE 1/10 % EPIDURAL INFUSION (WH - ANES)
INTRAMUSCULAR | Status: AC
  Administered 2016-06-30: 14 mL/h via EPIDURAL
  Filled 2016-06-30: qty 100

## 2016-06-30 MED ORDER — ZOLPIDEM TARTRATE 5 MG PO TABS: 5.0000 mg | ORAL_TABLET | Freq: Every evening | ORAL | Status: DC | PRN

## 2016-06-30 MED ORDER — DIPHENHYDRAMINE HCL 50 MG/ML IJ SOLN: 12.5000 mg | INTRAMUSCULAR | Status: DC | PRN

## 2016-06-30 MED ORDER — PHENYLEPHRINE 40 MCG/ML (10ML) SYRINGE FOR IV PUSH (FOR BLOOD PRESSURE SUPPORT)
80.0000 ug | PREFILLED_SYRINGE | INTRAVENOUS | Status: DC | PRN
  Filled 2016-06-30: qty 5

## 2016-06-30 MED ORDER — TETANUS-DIPHTH-ACELL PERTUSSIS 5-2.5-18.5 LF-MCG/0.5 IM SUSP: 0.5000 mL | Freq: Once | INTRAMUSCULAR | Status: DC

## 2016-06-30 MED ORDER — SIMETHICONE 80 MG PO CHEW: 80.0000 mg | CHEWABLE_TABLET | ORAL | Status: DC | PRN

## 2016-06-30 MED ORDER — DIPHENHYDRAMINE HCL 25 MG PO CAPS: 25.0000 mg | ORAL_CAPSULE | Freq: Four times a day (QID) | ORAL | Status: DC | PRN

## 2016-06-30 MED ORDER — PRENATAL MULTIVITAMIN CH
1.0000 | ORAL_TABLET | Freq: Every day | ORAL | Status: DC
  Administered 2016-06-30 – 2016-07-01 (×2): 1 via ORAL
  Filled 2016-06-30 (×2): qty 1

## 2016-06-30 MED ORDER — ONDANSETRON HCL 4 MG PO TABS: 4.0000 mg | ORAL_TABLET | ORAL | Status: DC | PRN

## 2016-06-30 MED ORDER — LIDOCAINE HCL (PF) 1 % IJ SOLN
INTRAMUSCULAR | Status: DC | PRN
  Administered 2016-06-30 (×2): 10 mL

## 2016-06-30 MED ORDER — DIBUCAINE 1 % RE OINT: 1.0000 "application " | TOPICAL_OINTMENT | RECTAL | Status: DC | PRN

## 2016-06-30 MED ORDER — LACTATED RINGERS IV SOLN: 500.0000 mL | Freq: Once | INTRAVENOUS | Status: DC

## 2016-06-30 MED ORDER — PHENYLEPHRINE 40 MCG/ML (10ML) SYRINGE FOR IV PUSH (FOR BLOOD PRESSURE SUPPORT)
80.0000 ug | PREFILLED_SYRINGE | INTRAVENOUS | Status: DC | PRN
  Administered 2016-06-30 (×2): 80 ug via INTRAVENOUS
  Filled 2016-06-30: qty 5

## 2016-06-30 MED ORDER — ONDANSETRON HCL 4 MG/2ML IJ SOLN: 4.0000 mg | INTRAMUSCULAR | Status: DC | PRN

## 2016-06-30 MED ORDER — SENNOSIDES-DOCUSATE SODIUM 8.6-50 MG PO TABS
2.0000 | ORAL_TABLET | ORAL | Status: DC
  Administered 2016-07-01 – 2016-07-02 (×2): 2 via ORAL
  Filled 2016-06-30 (×2): qty 2

## 2016-06-30 MED ORDER — FENTANYL 2.5 MCG/ML BUPIVACAINE 1/10 % EPIDURAL INFUSION (WH - ANES)
14.0000 mL/h | INTRAMUSCULAR | Status: DC | PRN
  Administered 2016-06-30: 14 mL/h via EPIDURAL

## 2016-06-30 MED ORDER — WITCH HAZEL-GLYCERIN EX PADS: 1.0000 "application " | MEDICATED_PAD | CUTANEOUS | Status: DC | PRN

## 2016-06-30 MED ORDER — BENZOCAINE-MENTHOL 20-0.5 % EX AERO
1.0000 "application " | INHALATION_SPRAY | CUTANEOUS | Status: DC | PRN
  Administered 2016-06-30: 1 via TOPICAL
  Filled 2016-06-30: qty 56

## 2016-06-30 MED ORDER — COCONUT OIL OIL
1.0000 "application " | TOPICAL_OIL | Status: DC | PRN
  Administered 2016-06-30: 1 via TOPICAL
  Filled 2016-06-30: qty 120

## 2016-06-30 MED ORDER — ACETAMINOPHEN 325 MG PO TABS
650.0000 mg | ORAL_TABLET | ORAL | Status: DC | PRN
  Administered 2016-07-01: 650 mg via ORAL
  Filled 2016-06-30: qty 2

## 2016-06-30 MED ORDER — FENTANYL 2.5 MCG/ML BUPIVACAINE 1/10 % EPIDURAL INFUSION (WH - ANES)
14.0000 mL/h | INTRAMUSCULAR | Status: DC | PRN
  Administered 2016-06-30 (×2): 14 mL/h via EPIDURAL
  Filled 2016-06-30: qty 100

## 2016-06-30 MED ORDER — IBUPROFEN 600 MG PO TABS
600.0000 mg | ORAL_TABLET | Freq: Four times a day (QID) | ORAL | Status: DC
  Administered 2016-06-30 – 2016-07-02 (×8): 600 mg via ORAL
  Filled 2016-06-30 (×8): qty 1

## 2016-06-30 MED ORDER — PHENYLEPHRINE 40 MCG/ML (10ML) SYRINGE FOR IV PUSH (FOR BLOOD PRESSURE SUPPORT)
PREFILLED_SYRINGE | INTRAVENOUS | Status: AC
  Administered 2016-06-30: 80 ug via INTRAVENOUS
  Filled 2016-06-30: qty 10

## 2016-06-30 NOTE — Anesthesia Procedure Notes (Signed)
Epidural Patient location during procedure: OB  Staffing Anesthesiologist: Keatin Benham  Preanesthetic Checklist Completed: patient identified, site marked, surgical consent, pre-op evaluation, timeout performed, IV checked, risks and benefits discussed and monitors and equipment checked  Epidural Patient position: sitting Prep: site prepped and draped and DuraPrep Patient monitoring: continuous pulse ox and blood pressure Approach: midline Location: L4-L5 Injection technique: LOR air  Needle:  Needle type: Tuohy  Needle gauge: 17 G Needle length: 9 cm and 9 Needle insertion depth: 9 cm Catheter type: closed end flexible Catheter size: 19 Gauge Catheter at skin depth: 14 cm Test dose: negative  Assessment Events: blood not aspirated, injection not painful, no injection resistance, negative IV test and no paresthesia     

## 2016-06-30 NOTE — Anesthesia Preprocedure Evaluation (Signed)
Anesthesia Evaluation  Patient identified by MRN, date of birth, ID band Patient awake    Reviewed: Allergy & Precautions, H&P , NPO status , Patient's Chart, lab work & pertinent test results, reviewed documented beta blocker date and time   Airway Mallampati: II  TM Distance: >3 FB Neck ROM: full    Dental no notable dental hx.    Pulmonary neg pulmonary ROS,    Pulmonary exam normal breath sounds clear to auscultation       Cardiovascular negative cardio ROS Normal cardiovascular exam Rhythm:regular Rate:Normal     Neuro/Psych negative neurological ROS  negative psych ROS   GI/Hepatic negative GI ROS, Neg liver ROS,   Endo/Other  negative endocrine ROS  Renal/GU negative Renal ROS  negative genitourinary   Musculoskeletal   Abdominal   Peds  Hematology negative hematology ROS (+)   Anesthesia Other Findings   Reproductive/Obstetrics (+) Pregnancy                             Anesthesia Physical Anesthesia Plan  ASA: II  Anesthesia Plan: Epidural   Post-op Pain Management:    Induction:   Airway Management Planned:   Additional Equipment:   Intra-op Plan:   Post-operative Plan:   Informed Consent: I have reviewed the patients History and Physical, chart, labs and discussed the procedure including the risks, benefits and alternatives for the proposed anesthesia with the patient or authorized representative who has indicated his/her understanding and acceptance.     Plan Discussed with:   Anesthesia Plan Comments:         Anesthesia Quick Evaluation  

## 2016-06-30 NOTE — Lactation Note (Signed)
This note was copied from a baby's chart. Lactation Consultation Note  Patient Name: Olivia Griffith OZDGU'YToday's Date: 06/30/2016 Reason for consult: Initial assessment  Baby 7 hours old. MOB's boyfriend alerted this LC that mom wanting to start pumping. As this LC entered the room, patient's bedside nurse, Debbie, RN had baby latched to left breast. Baby latched deeply with lips flanged, suckling rhythmically with a few swallows noted. Mom denies any pain. Mom reports that she has been able to hand express colostrum. Discussed the benefits of having baby at breast nursing. Enc offering lots of STS and nursing with cues. Discussed progression of milk coming to volume, and mom given a manual pump with review. Enc mom to do some additional pumping after baby nurses. Discussed with mom that baby is early, and if baby become sleepy at breast she would need to start using a DEBP. Discussed ways of knowing baby getting enough breast milk and enc mom to keep journal page of diapers and feedings.   [facility], aware of OP/BFSG and LC phone line assistance after D/C.   Maternal Data Has patient been taught Hand Expression?: Yes (Per mom.) Does the patient have breastfeeding experience prior to this delivery?: Yes  Feeding Feeding Type: Breast Fed Length of feed:  (this LC assess first 10 minutes of BF. )  LATCH Score/Interventions                      Lactation Tools Discussed/Used     Consult Status Consult Status: Follow-up Date: 07/01/16 Follow-up type: In-patient    Olivia Griffith 06/30/2016, 5:18 PM

## 2016-06-30 NOTE — Anesthesia Pain Management Evaluation Note (Signed)
  CRNA Pain Management Visit Note  Patient: Olivia Griffith, 26 y.o., female  "Hello [facility] am a [facility]. We have an anesthesia team available at all times to provide care throughout the hospital, including epidural management and anesthesia for C-section. I don't know your plan for the delivery whether it a natural birth, water birth, IV sedation, nitrous supplementation, doula or epidural, but we want to meet your pain goals."   1.Was your pain managed to your expectations on prior hospitalizations?   Yes   2.What is your expectation for pain management during this hospitalization?     Epidural  3.How can we help you reach that goal? Support prn  Record the patient's initial score and the patient's pain goal.   Pain: 0  Pain Goal: 6 [facility] wants you to be able to say your pain was always managed very well.  [facility]ideWRINKLE,Kaityln Kallstrom 06/30/2016

## 2016-06-30 NOTE — Progress Notes (Signed)
Patient ID: Olivia Griffith, female   DOB: 04/17/1990, 26 y.o.   MRN: [id number] Pt feeling some pressure.  afeb vss FHR category 1 with occasional mild variables  Cervix 90/5-6/-1  FSE placed to allow pt to get in better position (has been on back most of night to keep baby on monitor)  Entering active phase labor

## 2016-06-30 NOTE — Progress Notes (Signed)
Patient ID: Olivia LucyBrittany Cadiente, female   DOB: 06/26/1990, 26 y.o.   MRN: [id number] Pt contracted on her own and was uncomfortable so received epidural  afeb VSS FHR Category 1  Cervix 3/50/-2  AROM forebag  IUPC placed to adjust pitocin Pitocin started

## 2016-06-30 NOTE — Anesthesia Postprocedure Evaluation (Signed)
Anesthesia Post Note  Patient: Gren[place]Brittany Barmore  Procedure(s) Performed: * No procedures listed *  Patient location during evaluation: Mother Baby Anesthesia Type: Epidural Level of consciousness: awake Pain management: pain level controlled Vital Signs Assessment: post-procedure vital signs reviewed and stable Respiratory status: spontaneous breathing Cardiovascular status: stable Postop Assessment: no headache, no backache, epidural receding, patient able to bend at knees, no signs of nausea or vomiting and adequate PO intake Anesthetic complications: no        Last Vitals:  Vitals:   06/30/16 1230 06/30/16 1356  BP: (!) 106/58 111/63  Pulse: 77 78  Resp: 20 20  Temp: 36.7 C 36.6 C    Last Pain:  Vitals:   06/30/16 1356  TempSrc: Oral  PainSc: 0-No pain   Pain Goal: Patients Stated Pain Goal: 5 (06/29/16 2328)               Fanny DanceMULLINS,Shaterrica Territo

## 2016-07-01 LAB — CBC
HCT: 30.2 % — ABNORMAL LOW (ref 36.0–46.0)
HEMOGLOBIN: 10 g/dL — AB (ref 12.0–15.0)
MCH: 29.9 pg (ref 26.0–34.0)
MCHC: 33.1 g/dL (ref 30.0–36.0)
MCV: 90.1 fL (ref 78.0–100.0)
PLATELETS: 163 10*3/uL (ref 150–400)
RBC: 3.35 MIL/uL — AB (ref 3.87–5.11)
RDW: 15.6 % — ABNORMAL HIGH (ref 11.5–15.5)
WBC: 9.7 10*3/uL (ref 4.0–10.5)

## 2016-07-01 NOTE — Progress Notes (Signed)
PPD #1 No problems Afeb, VSS Fundus firm, NT at U-1 Continue routine postpartum care 

## 2016-07-01 NOTE — Lactation Note (Addendum)
This note was copied from a baby's chart. Lactation Consultation Note Mom having trouble latching baby. Baby not obtaining deep latch. Nipples sore. Rt. Nipple has center crack. Comfort gels given. Total Assisted in feeing at the breast. Mom has large pendulum breast. Mom holding baby fully dressed wrapped in blanket.  Hand expressed colostrum gave to baby w/curve tip syring while BF. Mom states she knows how to use syring. Mom excited about hand expressing.  When baby first latches, very painful. Once suckling a minute, pain eases. Experienced BF difficulty w/first child. Couldn't tolerate d/t painful latching. Nipples everts w/rolling of nipples. Do not stay everted w/o stimulation.  Encouraged BF in football hold, using props and comfortable positioning. Encouraged to obtain wide flange when latching, have good body alignment. Call for assistance if needed.  Mom using DEBP for stimulation and supplementation if any to give. Patient Name: Olivia Griffith Reason for consult: Follow-up assessment;Breast/nipple pain;Difficult latch   Maternal Data    Feeding Feeding Type: Breast Fed Length of feed: 20 min  LATCH Score/Interventions Latch: Repeated attempts needed to sustain latch, nipple held in mouth throughout feeding, stimulation needed to elicit sucking reflex. Intervention(s): Skin to skin;Teach feeding cues;Waking techniques Intervention(s): Adjust position;Assist with latch;Breast massage;Breast compression  Audible Swallowing: None Intervention(s): Skin to skin Intervention(s): Hand expression  Type of Nipple: Flat Intervention(s): Shells  Comfort (Breast/Nipple): Filling, red/small blisters or bruises, mild/mod discomfort  Problem noted: Mild/Moderate discomfort;Cracked, bleeding, blisters, bruises Interventions  (Cracked/bleeding/bruising/blister): Double electric pump Interventions (Mild/moderate discomfort): Hand massage;Hand  expression;Post-pump;Comfort gels  Hold (Positioning): Full assist, staff holds infant at breast Intervention(s): Breastfeeding basics reviewed;Support Pillows;Position options;Skin to skin  LATCH Score: 3  Lactation Tools Discussed/Used Tools: Pump;Flanges Flange Size: 27 Breast pump type: Manual Pump Review: Setup, frequency, and cleaning;Milk Storage Initiated by:: RN Date initiated:: 06/30/16   Consult Status Consult Status: Follow-up Date: 07/01/16 Follow-up type: In-patient    Olivia Griffith, Olivia Griffith, Olivia Griffith

## 2016-07-01 NOTE — Lactation Note (Signed)
This note was copied from a baby's chart. Lactation Consultation Note  Patient Name: Olivia Griffith Reason for consult: Follow-up assessment Baby is 4629 hours old, and per mom had been giving few bottles due to sore nipples.  With mom's permission LC assessed breast tissue and sore nipples. Right nipple appeared to have a positional strip, intact and no breakdown. Easily hand expressed off colostrum and had mom apply it to her nipples. Left nipple, no breakdown. Both areolas compressible, and easily hand expressed. Mom willing to work on latching on the left breast. After MBU RN Alfonzo FellerHeather Gaither assessed baby, LC placed baby STS on the left breast, baby latched with depth and fed in the football position lying on her side for 8-10 mins, swallows noted, increased with breast compressions. As LC worked with Mom, dad was at the bedside receptive to teaching and [facility] showed him how he can help her obtain the depth a the breast.  Mom was concerned her large breast is to heavy for baby's chest, LC showed both mom and dad how the baby can be turned on her side to latched.  Mom and dad seemed very excited the baby stayed latched and that mom was comfortable through the feeding.  LC recommended to mom to call for assistance with latching.   Maternal Data    Feeding Feeding Type: Breast Fed Length of feed: 10 min (swallows noted )  LATCH Score/Interventions Latch: Grasps breast easily, tongue down, lips flanged, rhythmical sucking. (left breast / football ) Intervention(s): Skin to skin;Teach feeding cues;Waking techniques Intervention(s): Adjust position;Assist with latch;Breast massage;Breast compression  Audible Swallowing: A few with stimulation  Type of Nipple: Everted at rest and after stimulation  Comfort (Breast/Nipple): Soft / non-tender     Hold (Positioning): Assistance needed to correctly position infant at breast and maintain latch. Intervention(s):  Breastfeeding basics reviewed;Support Pillows;Position options;Skin to skin  LATCH Score: 8  Lactation Tools Discussed/Used     Consult Status Consult Status: Follow-up Date: 07/02/16 Follow-up type: In-patient    Olivia Griffith Griffith, 3:49 PM

## 2016-07-02 MED ORDER — IBUPROFEN 600 MG PO TABS: 600.0000 mg | ORAL_TABLET | Freq: Four times a day (QID) | ORAL | 0 refills | Status: AC | PRN

## 2016-07-02 NOTE — Discharge Summary (Signed)
OB Discharge Summary     Patient Name: Olivia Griffith DOB: 06/15/1990 MRN: [id number]  Date of admission: 06/29/2016 Delivering MD: Huel CoteICHARDSON, KATHY   Date of discharge: 07/02/2016  Admitting diagnosis: 38 WKS, WATER BROKE Intrauterine pregnancy: 4142w4d     Secondary diagnosis:  Active Problems:   Ruptured, membranes, premature   NSVD (normal spontaneous vaginal delivery)  Additional problems: none     Discharge diagnosis: Term Pregnancy Delivered                                                                                                Post partum procedures:none  Augmentation: Pitocin  Complications: None  Hospital course:  Onset of Labor With Vaginal Delivery     26 y.o. yo [id number] at 5742w4d was admitted in Latent Labor on 06/29/2016. Patient had an uncomplicated labor course as follows:  Membrane Rupture Time/Date: 9:00 PM ,06/29/2016   Intrapartum Procedures: Episiotomy: None [1]                                         Lacerations:  None [1]  Patient had a delivery of a Viable infant. 06/30/2016  Information for the patient's newborn:  Dorris FetchRedmond, Girl [place]Brittany [[id number]][[id number]]  Delivery Method: Vaginal, Spontaneous Delivery (Filed from Delivery Summary)    Pateint had an uncomplicated postpartum course.  She is ambulating, tolerating a regular diet, passing flatus, and urinating well. Patient is discharged home in stable condition on 07/02/16.   Physical exam  Vitals:   07/01/16 0005 07/01/16 0529 07/01/16 1811 07/02/16 0547  BP: (!) 118/55 (!) 95/52 110/84 100/68  Pulse: 82 71 (!) 105 74  Resp: 20 18 (!) 21 18  Temp: 99 F (37.2 C) 98 F (36.7 C) 98.3 F (36.8 C) 98.7 F (37.1 C)  TempSrc: Axillary  Oral Oral  SpO2:      Weight:      Height:       General: alert, cooperative and no distress Lochia: appropriate Uterine Fundus: firm Incision: N/A DVT Evaluation: No evidence of DVT seen on physical exam. Labs: Lab Results  Component Value Date   WBC 9.7  07/01/2016   HGB 10.0 (L) 07/01/2016   HCT 30.2 (L) 07/01/2016   MCV 90.1 07/01/2016   PLT 163 07/01/2016   No flowsheet data found.  Discharge instruction: per After Visit Summary and "Baby and Me Booklet".  After visit meds:  Allergies as of 07/02/2016      Reactions   Demerol [meperidine] Anaphylaxis      Medication List    TAKE these medications   ibuprofen 600 MG tablet Commonly known as:  ADVIL,MOTRIN Take 1 tablet (600 mg total) by mouth every 6 (six) hours as needed for headache, moderate pain or cramping.   PRENATAL GUMMIES/DHA & FA 0.4-32.5 MG Chew Chew 2 each by mouth daily.       Diet: routine diet  Activity: Advance as tolerated. Pelvic rest for 6 weeks.   Outpatient follow up:6 weeks Follow up Appt:No  future appointments. Follow up Visit:No Follow-up on file.  Postpartum contraception: Undecided and hopes to breastfeed  Newborn Data: Live born female  Birth Weight: 6 lb 13.4 oz (3100 g) APGAR: 9, 9  Baby Feeding: Bottle and Breast Disposition:home with mother   07/02/2016 Sharol Given Candy Leverett, DO

## 2016-07-02 NOTE — Progress Notes (Signed)
Discharge education complete, discharge instructions and follow up appointment discussed. Patient verbalized understanding. 

## 2016-07-02 NOTE — Discharge Instructions (Signed)
Nothing in vagina for 6 weeks.  No sex, tampons, and douching.  Other instructions as in [facility] Discharge Booklet. °

## 2016-07-02 NOTE — Progress Notes (Addendum)
Patient ID: Olivia Griffith Olivia Griffith, female   DOB: 04/27/1990, 26 y.o.   MRN: [id number] PPD#2 Pt doing well. Reports some cramping with breastfeeding. Lochia mild. Denies any fever, chills, CP or headaches. Ambulating and tolerating diet. Voiding well. Bonding well with baby - breast/bottlefeeding Ready for discharge to home today VSS ABD- FF and below umbil EXT - mild edema only +1 b/l; no homans  A/P: PPD#2 s/p svd - stable        Reviewed discharge instructions and expected pp sx        F/u for pp visit in 6 weeks        Reviewed bc options - unsure (prog options only)        Discharge to home today
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
## 2016-08-29 ENCOUNTER — Emergency Department (HOSPITAL_COMMUNITY): Payer: 59

## 2016-08-29 ENCOUNTER — Emergency Department (HOSPITAL_COMMUNITY)
Admission: EM | Admit: 2016-08-29 | Discharge: 2016-08-29 | Disposition: A | Discharge status: Home / Self Care | Payer: 59 | Attending: Emergency Medicine | Admitting: Emergency Medicine

## 2016-08-29 DIAGNOSIS — R072 Precordial pain: Secondary | ICD-10-CM | POA: Diagnosis not present

## 2016-08-29 DIAGNOSIS — R945 Abnormal results of liver function studies: Secondary | ICD-10-CM | POA: Diagnosis not present

## 2016-08-29 DIAGNOSIS — R1084 Generalized abdominal pain: Secondary | ICD-10-CM | POA: Insufficient documentation

## 2016-08-29 DIAGNOSIS — R748 Abnormal levels of other serum enzymes: Secondary | ICD-10-CM

## 2016-08-29 DIAGNOSIS — R079 Chest pain, unspecified: Secondary | ICD-10-CM | POA: Diagnosis present

## 2016-08-29 DIAGNOSIS — R112 Nausea with vomiting, unspecified: Secondary | ICD-10-CM | POA: Insufficient documentation

## 2016-08-29 DIAGNOSIS — R0602 Shortness of breath: Secondary | ICD-10-CM | POA: Insufficient documentation

## 2016-08-29 LAB — WET PREP, GENITAL
CLUE CELLS WET PREP: NONE SEEN
Sperm: NONE SEEN
Trich, Wet Prep: NONE SEEN
Yeast Wet Prep HPF POC: NONE SEEN

## 2016-08-29 LAB — COMPREHENSIVE METABOLIC PANEL
ALBUMIN: 3.9 g/dL (ref 3.5–5.0)
ALK PHOS: 99 U/L (ref 38–126)
ALT: 75 U/L — ABNORMAL HIGH (ref 14–54)
AST: 196 U/L — AB (ref 15–41)
Anion gap: 9 (ref 5–15)
BILIRUBIN TOTAL: 1.4 mg/dL — AB (ref 0.3–1.2)
BUN: 5 mg/dL — AB (ref 6–20)
CALCIUM: 9.7 mg/dL (ref 8.9–10.3)
CO2: 22 mmol/L (ref 22–32)
Chloride: 106 mmol/L (ref 101–111)
Creatinine, Ser: 0.88 mg/dL (ref 0.44–1.00)
GFR calc Af Amer: >60 mL/min (ref >60)
GLUCOSE: 102 mg/dL — AB (ref 65–99)
POTASSIUM: 3.9 mmol/L (ref 3.5–5.1)
Sodium: 137 mmol/L (ref 135–145)
TOTAL PROTEIN: 7.3 g/dL (ref 6.5–8.1)

## 2016-08-29 LAB — I-STAT TROPONIN, ED: TROPONIN I, POC: 0 ng/mL (ref 0.00–0.08)

## 2016-08-29 LAB — URINALYSIS, ROUTINE W REFLEX MICROSCOPIC
BILIRUBIN URINE: NEGATIVE
GLUCOSE, UA: NEGATIVE mg/dL
Hgb urine dipstick: NEGATIVE
KETONES UR: NEGATIVE mg/dL
LEUKOCYTES UA: NEGATIVE
NITRITE: NEGATIVE
PH: 7 (ref 5.0–8.0)
Protein, ur: NEGATIVE mg/dL
SPECIFIC GRAVITY, URINE: 1.011 (ref 1.005–1.030)

## 2016-08-29 LAB — CBC
HEMATOCRIT: 35.7 % — AB (ref 36.0–46.0)
HEMOGLOBIN: 11.5 g/dL — AB (ref 12.0–15.0)
MCH: 28.8 pg (ref 26.0–34.0)
MCHC: 32.2 g/dL (ref 30.0–36.0)
MCV: 89.3 fL (ref 78.0–100.0)
Platelets: 234 10*3/uL (ref 150–400)
RBC: 4 MIL/uL (ref 3.87–5.11)
RDW: 14.7 % (ref 11.5–15.5)
WBC: 6.1 10*3/uL (ref 4.0–10.5)

## 2016-08-29 LAB — LIPASE, BLOOD: Lipase: 25 U/L (ref 11–51)

## 2016-08-29 LAB — D-DIMER, QUANTITATIVE: D-Dimer, Quant: 1.21 ug/mL-FEU — ABNORMAL HIGH (ref 0.00–0.50)

## 2016-08-29 LAB — POC URINE PREG, ED: PREG TEST UR: NEGATIVE

## 2016-08-29 MED ORDER — IOPAMIDOL (ISOVUE-370) INJECTION 76%
INTRAVENOUS | Status: AC
  Administered 2016-08-29: 100 mL via INTRAVENOUS
  Filled 2016-08-29: qty 100

## 2016-08-29 MED ORDER — GI COCKTAIL ~~LOC~~
30.0000 mL | Freq: Once | ORAL | Status: AC
  Administered 2016-08-29: 30 mL via ORAL
  Filled 2016-08-29: qty 30

## 2016-08-29 MED ORDER — DIPHENHYDRAMINE HCL 50 MG/ML IJ SOLN
25.0000 mg | Freq: Once | INTRAMUSCULAR | Status: AC
  Administered 2016-08-29: 25 mg via INTRAVENOUS
  Filled 2016-08-29: qty 1

## 2016-08-29 MED ORDER — OMEPRAZOLE 20 MG PO CPDR: 20.0000 mg | DELAYED_RELEASE_CAPSULE | Freq: Every day | ORAL | 0 refills | Status: DC

## 2016-08-29 MED ORDER — MORPHINE SULFATE (PF) 4 MG/ML IV SOLN
4.0000 mg | Freq: Once | INTRAVENOUS | Status: AC
  Administered 2016-08-29: 4 mg via INTRAVENOUS
  Filled 2016-08-29: qty 1

## 2016-08-29 MED ORDER — ONDANSETRON HCL 4 MG/2ML IJ SOLN
4.0000 mg | Freq: Once | INTRAMUSCULAR | Status: AC
  Administered 2016-08-29: 4 mg via INTRAVENOUS
  Filled 2016-08-29: qty 2
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
## 2016-08-29 NOTE — ED Notes (Signed)
Patient transported to X-ray 

## 2016-08-29 NOTE — ED Notes (Signed)
After giving morphine, pt's arm felt like it was "burning." Pt has rash up and down right arm. Notified PA, will give benadryl. No other symptoms

## 2016-08-29 NOTE — ED Notes (Signed)
Patient transported to CT 

## 2016-08-29 NOTE — ED Notes (Signed)
After pt received the benadryl, she states her arm feels much better.

## 2016-08-29 NOTE — ED Notes (Signed)
Pt returns from radiology. 
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
## 2016-08-29 NOTE — Discharge Instructions (Signed)
Please read and follow all provided instructions.  Your diagnoses today include:  1. Precordial pain   2. Chest pain   3. Shortness of breath   4. Generalized abdominal pain     Tests performed today include:  An EKG of your heart  A chest x-ray   Cardiac enzymes - a blood test for heart muscle damage  Blood counts and electrolytes - shows elevated liver tests, you will need to have this rechecked by your doctor  CT scan of your chest - no blood clot  Vital signs. See below for your results today.   Medications prescribed:   Omeprazole (Prilosec) - stomach acid reducer  This medication can be found over-the-counter  Take any prescribed medications only as directed.  Follow-up instructions: Please follow-up with your primary care provider for recheck of your symptoms as well as your slightly high liver tests   Return instructions:  SEEK IMMEDIATE MEDICAL ATTENTION IF:  You have severe chest pain, especially if the pain is crushing or pressure-like and spreads to the arms, back, neck, or jaw, or if you have sweating, nausea (feeling sick to your stomach), or shortness of breath. THIS IS AN EMERGENCY. Don't wait to see if the pain will go away. Get medical help at once. Call 911 or 0 (operator). DO NOT drive yourself to the hospital.   Your chest pain gets worse and does not go away with rest.   You have an attack of chest pain lasting longer than usual, despite rest and treatment with the medications your caregiver has prescribed.   You wake from sleep with chest pain or shortness of breath.  You feel dizzy or faint.  You have chest pain not typical of your usual pain for which you originally saw your caregiver.   You have any other emergent concerns regarding your health.  Additional Information: Chest pain comes from many different causes. Your caregiver has diagnosed you as having chest pain that is not specific for one problem, but does not require admission.   You are at low risk for an acute heart condition or other serious illness.   Your vital signs today were: BP 122/65    Pulse 67    Temp 97.5 F (36.4 C) (Oral)    Resp 19    Ht 5\' 7"  (1.702 m)    Wt 130.2 kg (287 lb)    LMP 08/26/2016 (Within Days)    SpO2 97%    BMI 44.95 kg/m  If your blood pressure (BP) was elevated above 135/85 this visit, please have this repeated by your doctor within one month. --------------

## 2016-09-03 LAB — GC/CHLAMYDIA PROBE AMP (~~LOC~~) NOT AT ARMC
Chlamydia: NEGATIVE
Neisseria Gonorrhea: NEGATIVE

## 2018-03-25 ENCOUNTER — Encounter (HOSPITAL_COMMUNITY): Payer: Self-pay | Admitting: *Deleted

## 2018-03-25 ENCOUNTER — Inpatient Hospital Stay (HOSPITAL_COMMUNITY)
Admission: AD | Admit: 2018-03-25 | Discharge: 2018-03-25 | Disposition: A | Discharge status: Home / Self Care | Payer: Medicaid Other | Attending: Obstetrics and Gynecology | Admitting: Obstetrics and Gynecology

## 2018-03-25 ENCOUNTER — Inpatient Hospital Stay (HOSPITAL_COMMUNITY): Payer: Medicaid Other

## 2018-03-25 DIAGNOSIS — Z3A10 10 weeks gestation of pregnancy: Secondary | ICD-10-CM | POA: Insufficient documentation

## 2018-03-25 DIAGNOSIS — O209 Hemorrhage in early pregnancy, unspecified: Secondary | ICD-10-CM | POA: Diagnosis not present

## 2018-03-25 DIAGNOSIS — O26891 Other specified pregnancy related conditions, first trimester: Secondary | ICD-10-CM

## 2018-03-25 DIAGNOSIS — R109 Unspecified abdominal pain: Secondary | ICD-10-CM

## 2018-03-25 DIAGNOSIS — O26899 Other specified pregnancy related conditions, unspecified trimester: Secondary | ICD-10-CM

## 2018-03-25 DIAGNOSIS — O3680X0 Pregnancy with inconclusive fetal viability, not applicable or unspecified: Secondary | ICD-10-CM

## 2018-03-25 LAB — URINALYSIS, ROUTINE W REFLEX MICROSCOPIC
Bilirubin Urine: NEGATIVE
Glucose, UA: NEGATIVE mg/dL
HGB URINE DIPSTICK: NEGATIVE
Ketones, ur: NEGATIVE mg/dL
Leukocytes, UA: NEGATIVE
NITRITE: NEGATIVE
PROTEIN: NEGATIVE mg/dL
SPECIFIC GRAVITY, URINE: 1.006 (ref 1.005–1.030)
pH: 7 (ref 5.0–8.0)

## 2018-03-25 LAB — WET PREP, GENITAL
CLUE CELLS WET PREP: NONE SEEN
Sperm: NONE SEEN
Trich, Wet Prep: NONE SEEN
YEAST WET PREP: NONE SEEN

## 2018-03-25 LAB — HCG, QUANTITATIVE, PREGNANCY: hCG, Beta Chain, Quant, S: 23843 m[IU]/mL — ABNORMAL HIGH (ref <5)

## 2018-03-25 LAB — POCT PREGNANCY, URINE: PREG TEST UR: POSITIVE — AB

## 2018-03-25 MED ORDER — PRENATAL VITAMINS 0.8 MG PO TABS: 1.0000 | ORAL_TABLET | Freq: Every day | ORAL | 12 refills | Status: DC

## 2018-03-25 NOTE — Discharge Instructions (Signed)
First Trimester of Pregnancy ° °The first trimester of pregnancy is from week 1 until the end of week 13 (months 1 through 3). During this time, your baby will begin to develop inside you. At 6-8 weeks, the eyes and face are formed, and the heartbeat can be seen on ultrasound. At the end of 12 weeks, all the baby's organs are formed. Prenatal care is all the medical care you receive before the birth of your baby. Make sure you get good prenatal care and follow all of your doctor's instructions. °Follow these instructions at home: °Medicines °· Take over-the-counter and prescription medicines only as told by your doctor. Some medicines are safe and some medicines are not safe during pregnancy. °· Take a prenatal vitamin that contains at least 600 micrograms (mcg) of folic acid. °· If you have trouble pooping (constipation), take medicine that will make your stool soft (stool softener) if your doctor approves. °Eating and drinking ° °· Eat regular, healthy meals. °· Your doctor will tell you the amount of weight gain that is right for you. °· Avoid raw meat and uncooked cheese. °· If you feel sick to your stomach (nauseous) or throw up (vomit): °? Eat 4 or 5 small meals a day instead of 3 large meals. °? Try eating a few soda crackers. °? Drink liquids between meals instead of during meals. °· To prevent constipation: °? Eat foods that are high in fiber, like fresh fruits and vegetables, whole grains, and beans. °? Drink enough fluids to keep your pee (urine) clear or pale yellow. °Activity °· Exercise only as told by your doctor. Stop exercising if you have cramps or pain in your lower belly (abdomen) or low back. °· Do not exercise if it is too hot, too humid, or if you are in a place of great height (high altitude). °· Try to avoid standing for long periods of time. Move your legs often if you must stand in one place for a long time. °· Avoid heavy lifting. °· Wear low-heeled shoes. Sit and stand up  straight. °· You can have sex unless your doctor tells you not to. °Relieving pain and discomfort °· Wear a good support bra if your breasts are sore. °· Take warm water baths (sitz baths) to soothe pain or discomfort caused by hemorrhoids. Use hemorrhoid cream if your doctor says it is okay. °· Rest with your legs raised if you have leg cramps or low back pain. °· If you have puffy, bulging veins (varicose veins) in your legs: °? Wear support hose or compression stockings as told by your doctor. °? Raise (elevate) your feet for 15 minutes, 3-4 times a day. °? Limit salt in your food. °Prenatal care °· Schedule your prenatal visits by the twelfth week of pregnancy. °· Write down your questions. Take them to your prenatal visits. °· Keep all your prenatal visits as told by your doctor. This is important. °Safety °· Wear your seat belt at all times when driving. °· Make a list of emergency phone numbers. The list should include numbers for family, friends, the hospital, and police and fire departments. °General instructions °· Ask your doctor for a referral to a local prenatal class. Begin classes no later than at the start of month 6 of your pregnancy. °· Ask for help if you need counseling or if you need help with nutrition. Your doctor can give you advice or tell you where to go for help. °· Do not use hot tubs, steam   rooms, or saunas.  Do not douche or use tampons or scented sanitary pads.  Do not cross your legs for long periods of time.  Avoid all herbs and alcohol. Avoid drugs that are not approved by your doctor.  Do not use any tobacco products, including cigarettes, chewing tobacco, and electronic cigarettes. If you need help quitting, ask your doctor. You may get counseling or other support to help you quit.  Avoid cat litter boxes and soil used by cats. These carry germs that can cause birth defects in the baby and can cause a loss of your baby (miscarriage) or stillbirth.  Visit your dentist.  At home, brush your teeth with a soft toothbrush. Be gentle when you floss. Contact a doctor if:  You are dizzy.  You have mild cramps or pressure in your lower belly.  You have a nagging pain in your belly area.  You continue to feel sick to your stomach, you throw up, or you have watery poop (diarrhea).  You have a bad smelling fluid coming from your vagina.  You have pain when you pee (urinate).  You have increased puffiness (swelling) in your face, hands, legs, or ankles. Get help right away if:  You have a fever.  You are leaking fluid from your vagina.  You have spotting or bleeding from your vagina.  You have very bad belly cramping or pain.  You gain or lose weight rapidly.  You throw up blood. It may look like coffee grounds.  You are around people who have [place] measles, fifth disease, or chickenpox.  You have a very bad headache.  You have shortness of breath.  You have any kind of trauma, such as from a fall or a car accident. Summary  The first trimester of pregnancy is from week 1 until the end of week 13 (months 1 through 3).  To take care of yourself and your unborn baby, you will need to eat healthy meals, take medicines only if your doctor tells you to do so, and do activities that are safe for you and your baby.  Keep all follow-up visits as told by your doctor. This is important as your doctor will have to ensure that your baby is healthy and growing well. This information is not intended to replace advice given to you by your health care provider. Make sure you discuss any questions you have with your health care provider. Document Released: 09/11/2007 Document Revised: 04/02/2016 Document Reviewed: 04/02/2016 Elsevier Interactive Patient Education  2019 [employer] Abdominal Pain During Pregnancy  Abdominal pain is common during pregnancy, and has many possible causes. Some causes are more serious than others, and sometimes the cause is not  known. Abdominal pain can be a sign that labor is starting. It can also be caused by normal growth and stretching of muscles and ligaments during pregnancy. Always tell your health care provider if you have any abdominal pain. Follow these instructions at home:  Do not have sex or put anything in your vagina until your pain goes away completely.  Get plenty of rest until your pain improves.  Drink enough fluid to keep your urine pale yellow.  Take over-the-counter and prescription medicines only as told by your health care provider.  Keep all follow-up visits as told by your health care provider. This is important. Contact a health care provider if:  Your pain continues or gets worse after resting.  You have lower abdominal pain that: ? Comes and goes at regular  intervals. ? Spreads to your back. ? Is similar to menstrual cramps.  You have pain or burning when you urinate. Get help right away if:  You have a fever or chills.  You have vaginal bleeding.  You are leaking fluid from your vagina.  You are passing tissue from your vagina.  You have vomiting or diarrhea that lasts for more than 24 hours.  You feel very weak or faint.  You have shortness of breath.  You develop severe pain in your upper abdomen. Summary  Abdominal pain is common during pregnancy, and has many possible causes.  If you experience abdominal pain during pregnancy, tell your health care provider right away.  Follow your health care provider's home care instructions and keep all follow-up visits as directed. This information is not intended to replace advice given to you by your health care provider. Make sure you discuss any questions you have with your health care provider. Document Released: 03/25/2005 Document Revised: 06/27/2016 Document Reviewed: 06/27/2016 Elsevier Interactive Patient Education  2019 [employer].

## 2018-03-25 NOTE — MAU Provider Note (Addendum)
History    Olivia Griffith is a 27 y.o. [id number] at 10.6wks by LMP that is not definite.  Patient reports that she had a positive home UPT, but has abnormal cycles.  She further states that her last period lasted only one day, which is abnormal, and that she had not had one for 2-3 months prior to this.  Patient presents today with report of abdominal pain and cramping (7/10) with new onset of vaginal bleeding and discharge.  Patient states she only witnesses bleeding, that is bright red and without clots, when wiping and did not require any sanitary napkins.  Patient denies fever, chills, constipation, or recent sexual activity (x48 hours).  Patient also denies taking any OTC medication for her pain.    Patient Active Problem List   Diagnosis Date Noted  . NSVD (normal spontaneous vaginal delivery) 06/30/2016  . Ruptured, membranes, premature 06/29/2016    Chief Complaint  Patient presents with  . Abdominal Pain  . Back Pain  . Vaginal Bleeding   HPI  OB History    Gravida  4   Para  2   Term  2   Preterm  0   AB  1   Living  2     SAB  0   TAB  1   Ectopic  0   Multiple  0   Live Births  2           Past Medical History:  Diagnosis Date  . Medical history non-contributory   . Obese     Past Surgical History:  Procedure Laterality Date  . NO PAST SURGERIES      No family history on file.  Social History   Tobacco Use  . Smoking status: Never Smoker  . Smokeless tobacco: Never Used  Substance Use Topics  . Alcohol use: No  . Drug use: No    Allergies:  Allergies  Allergen Reactions  . Demerol [Meperidine] Anaphylaxis  . Morphine And Related Rash    Itching and rash at administration site    Medications Prior to Admission  Medication Sig Dispense Refill Last Dose  . omeprazole (PRILOSEC) 20 MG capsule Take 1 capsule (20 mg total) by mouth daily. 30 capsule 0   . Prenatal MV-Min-FA-Omega-3 (PRENATAL GUMMIES/DHA & FA) 0.4-32.5 MG CHEW  Chew 2 each by mouth daily.   06/29/2016 at Unknown time    Review of Systems  Constitutional: Negative for chills and fever.  Gastrointestinal: Positive for abdominal pain. Negative for constipation and diarrhea.  Genitourinary: Negative for dysuria.   Physical Exam   Blood pressure (!) 116/58, pulse (!) 105, temperature 98.4 F (36.9 C), temperature source Oral, resp. rate 16, height 5\' 8"  (1.727 m), weight (!) 145.2 kg, last menstrual period 01/08/2018, unknown if currently breastfeeding.  Results for orders placed or performed during the hospital encounter of 03/25/18 (from the past 24 hour(s))  Urinalysis, Routine w reflex microscopic     Status: Abnormal   Collection Time: 03/25/18  5:45 PM  Result Value Ref Range   Color, Urine STRAW (A) YELLOW   APPearance CLEAR CLEAR   Specific Gravity, Urine 1.006 1.005 - 1.030   pH 7.0 5.0 - 8.0   Glucose, UA NEGATIVE NEGATIVE mg/dL   Hgb urine dipstick NEGATIVE NEGATIVE   Bilirubin Urine NEGATIVE NEGATIVE   Ketones, ur NEGATIVE NEGATIVE mg/dL   Protein, ur NEGATIVE NEGATIVE mg/dL   Nitrite NEGATIVE NEGATIVE   Leukocytes, UA NEGATIVE NEGATIVE  Pregnancy, urine POC  Status: Abnormal   Collection Time: 03/25/18  5:48 PM  Result Value Ref Range   Preg Test, Ur POSITIVE (A) NEGATIVE  Wet prep, genital     Status: Abnormal   Collection Time: 03/25/18  6:23 PM  Result Value Ref Range   Yeast Wet Prep HPF POC NONE SEEN NONE SEEN   Trich, Wet Prep NONE SEEN NONE SEEN   Clue Cells Wet Prep HPF POC NONE SEEN NONE SEEN   WBC, Wet Prep HPF POC FEW (A) NONE SEEN   Sperm NONE SEEN   hCG, quantitative, pregnancy     Status: Abnormal   Collection Time: 03/25/18  6:29 PM  Result Value Ref Range   hCG, Beta Chain, Quant, S 23,843 (H) <5 mIU/mL     Physical Exam  Constitutional: She is oriented to person, place, and time.  Obese  HENT:  Head: Normocephalic and atraumatic.  Eyes: Pupils are equal, round, and reactive to light.  Conjunctivae are normal.  Neck: Normal range of motion.  Cardiovascular: Normal rate and normal heart sounds.  Respiratory: Effort normal and breath sounds normal.  GI: Soft. Bowel sounds are normal. There is no abdominal tenderness.  Genitourinary: Cervix exhibits no motion tenderness and no discharge.    Vaginal tenderness present.     No vaginal bleeding.  There is tenderness in the vagina. No bleeding in the vagina.    Genitourinary Comments: Sterile Speculum Exam: -Vaginal Vault: Pink mucosa.  Scant amt milky white discharge noted -wet prep collected -Cervix:Large, parous, small nabothian cyst noted at 6'o'clock. No polyps or lesions.  No discharge from os-GC/CT collected -Bimanual Exam: Closed/Long/Thick/Ballotable -Tenderness in cul de sac-bilaterally -UTA: Uterus and adnexa d/t body habitus   Musculoskeletal: Normal range of motion.  Neurological: She is alert and oriented to person, place, and time.  Skin: Skin is warm and dry.    ED Course  Assessment: IUP at 10.6wks-Estimated Date Vaginal Bleeding Abdominal Cramping/Pain   Plan: Pelvic exam as charted Labs: Wet prep, GC/CT, Quant hCG Will await results and schedule for [place] if quant at appropriate level.   Follow Up (1940): Quant returns as above [place] ordered  Follow Up (2010): -Care report given to M.Mayford Knife, CNM   Cherre Robins CNM, MSN 03/25/2018 6:05 PM   US Ob Less Than 14 Weeks With Ob Transvaginal  Result Date: 03/25/2018 CLINICAL DATA:  Vaginal bleeding in 1st trimester pregnancy. Gestational age by LMP of 10 weeks 6 days. EXAM: OBSTETRIC <14 WK [place] AND TRANSVAGINAL OB US TECHNIQUE: Both transabdominal and transvaginal ultrasound examinations were performed for complete evaluation of the gestation as well as the maternal uterus, adnexal regions, and pelvic cul-de-sac. Transvaginal technique was performed to assess early pregnancy. COMPARISON:  None. FINDINGS: Intrauterine gestational sac: Single Yolk sac:   Visualized. Embryo:  Visualized. Cardiac Activity: Visualized. Heart Rate: 119 bpm CRL:  6 mm   6 w   1 d                  [place] EDC: 11/17/2018 Subchorionic hemorrhage:  None visualized. Maternal uterus/adnexae: Normal appearance of right ovary. Left ovary was not directly visualized, however no adnexal mass identified. No abnormal free-fluid. IMPRESSION: Single living IUP measuring 6 weeks 1 day, with [place] EDC of 11/17/2018. No significant maternal uterine or adnexal abnormality identified. Electronically Signed   By: Myles Rosenthal M.D.   On: 03/25/2018 20:18   Reviewed results of [place] with patient Recommend she seek prenatal care, has appt with Dr Mindi Slicker  Encouraged to return here or to other Urgent Care/ED if she develops worsening of symptoms, increase in pain, fever, or other concerning symptoms.   Aviva Signs, CNM

## 2018-03-25 NOTE — MAU Note (Signed)
Pt started cramping around 1300 today, became progressively worse.  Then noted spotting & discharge, also having back pain now.  Positive HPT 1 1/2 weeks ago.  Has appointment with [facility] this Friday.

## 2018-03-26 LAB — GC/CHLAMYDIA PROBE AMP (~~LOC~~) NOT AT ARMC
Chlamydia: NEGATIVE
Neisseria Gonorrhea: NEGATIVE

## 2018-04-08 NOTE — L&D Delivery Note (Signed)
Delivery Note I checked her at 0615, felt like baby was sitting in vagina.  She pushed twice, baby expelled breech.  Baby did have some movements.  Cord clamped and cut.  With fundal massage and gentle traction on the cord, the placenta did not seem ready.  On VE, cervix 4 cm dilated, placenta not at os.  An additional 800 mcg cytotec placed vaginally to help release placenta.  Anesthesia:  Epidural Episiotomy:  None Lacerations:  None Est. Blood Loss (mL):  100 cc clot so far  Will monitor closely for bleeding and wait for placenta to separate and deliver.  Briefly discussed possibility of needing D&C.  Leighton Roach Latasha Puskas 07/02/2018, 6:38 AM

## 2018-04-16 LAB — OB RESULTS CONSOLE RUBELLA ANTIBODY, IGM: Rubella: IMMUNE

## 2018-04-16 LAB — OB RESULTS CONSOLE HEPATITIS B SURFACE ANTIGEN: Hepatitis B Surface Ag: NEGATIVE

## 2018-04-16 LAB — OB RESULTS CONSOLE GC/CHLAMYDIA
Chlamydia: NEGATIVE
Gonorrhea: NEGATIVE

## 2018-04-16 LAB — OB RESULTS CONSOLE HIV ANTIBODY (ROUTINE TESTING): HIV: NONREACTIVE

## 2018-04-16 LAB — OB RESULTS CONSOLE ABO/RH: RH Type: NEGATIVE

## 2018-04-16 LAB — OB RESULTS CONSOLE RPR: RPR: NONREACTIVE

## 2018-05-07 ENCOUNTER — Inpatient Hospital Stay (HOSPITAL_COMMUNITY)
Admission: AD | Admit: 2018-05-07 | Discharge: 2018-05-07 | Disposition: A | Discharge status: Home / Self Care | Payer: Medicaid Other | Attending: Obstetrics and Gynecology | Admitting: Obstetrics and Gynecology

## 2018-05-07 ENCOUNTER — Other Ambulatory Visit: Payer: Self-pay

## 2018-05-07 ENCOUNTER — Encounter (HOSPITAL_COMMUNITY): Payer: Self-pay | Admitting: *Deleted

## 2018-05-07 DIAGNOSIS — O26891 Other specified pregnancy related conditions, first trimester: Secondary | ICD-10-CM

## 2018-05-07 DIAGNOSIS — O3441 Maternal care for other abnormalities of cervix, first trimester: Secondary | ICD-10-CM | POA: Insufficient documentation

## 2018-05-07 DIAGNOSIS — O209 Hemorrhage in early pregnancy, unspecified: Secondary | ICD-10-CM | POA: Insufficient documentation

## 2018-05-07 DIAGNOSIS — O360110 Maternal care for anti-D [Rh] antibodies, first trimester, not applicable or unspecified: Secondary | ICD-10-CM

## 2018-05-07 DIAGNOSIS — O26893 Other specified pregnancy related conditions, third trimester: Secondary | ICD-10-CM | POA: Insufficient documentation

## 2018-05-07 DIAGNOSIS — N841 Polyp of cervix uteri: Secondary | ICD-10-CM | POA: Diagnosis not present

## 2018-05-07 DIAGNOSIS — Z6791 Unspecified blood type, Rh negative: Secondary | ICD-10-CM | POA: Insufficient documentation

## 2018-05-07 DIAGNOSIS — Z3A12 12 weeks gestation of pregnancy: Secondary | ICD-10-CM | POA: Insufficient documentation

## 2018-05-07 LAB — CBC
HEMATOCRIT: 37.4 % (ref 36.0–46.0)
HEMOGLOBIN: 12.2 g/dL (ref 12.0–15.0)
MCH: 28.6 pg (ref 26.0–34.0)
MCHC: 32.6 g/dL (ref 30.0–36.0)
MCV: 87.8 fL (ref 80.0–100.0)
Platelets: 213 10*3/uL (ref 150–400)
RBC: 4.26 MIL/uL (ref 3.87–5.11)
RDW: 14.6 % (ref 11.5–15.5)
WBC: 7.3 10*3/uL (ref 4.0–10.5)
nRBC: 0 % (ref 0.0–0.2)

## 2018-05-07 LAB — URINALYSIS, ROUTINE W REFLEX MICROSCOPIC
BILIRUBIN URINE: NEGATIVE
Glucose, UA: NEGATIVE mg/dL
KETONES UR: NEGATIVE mg/dL
NITRITE: NEGATIVE
PH: 5.5 (ref 5.0–8.0)
PROTEIN: NEGATIVE mg/dL
Specific Gravity, Urine: <1.005 — ABNORMAL LOW (ref 1.005–1.030)

## 2018-05-07 LAB — URINALYSIS, MICROSCOPIC (REFLEX)

## 2018-05-07 MED ORDER — RHO D IMMUNE GLOBULIN 1500 UNIT/2ML IJ SOSY
300.0000 ug | PREFILLED_SYRINGE | Freq: Once | INTRAMUSCULAR | Status: AC
  Administered 2018-05-07: 300 ug via INTRAMUSCULAR
  Filled 2018-05-07: qty 2
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
## 2018-05-07 NOTE — Discharge Instructions (Signed)
Vaginal Bleeding During Pregnancy, First Trimester    A small amount of bleeding (spotting) from the vagina is common during early pregnancy. Sometimes the bleeding is normal and does not cause problems. At other times, though, bleeding may be a sign of something serious. Tell your doctor about any bleeding from your vagina right away.  Follow these instructions at home:  Activity  · Follow your doctor's instructions about how active you can be.  · If needed, make plans for someone to help with your normal activities.  · Do not have sex or orgasms until your doctor says that this is safe.  General instructions  · Take over-the-counter and prescription medicines only as told by your doctor.  · Watch your condition for any changes.  · Write down:  ? The number of pads you use each day.  ? How often you change pads.  ? How soaked (saturated) your pads are.  · Do not use tampons.  · Do not douche.  · If you pass any tissue from your vagina, save it to show to your doctor.  · Keep all follow-up visits as told by your doctor. This is important.  Contact a doctor if:  · You have vaginal bleeding at any time while you are pregnant.  · You have cramps.  · You have a fever.  Get help right away if:  · You have very bad cramps in your back or belly (abdomen).  · You pass large clots or a lot of tissue from your vagina.  · Your bleeding gets worse.  · You feel light-headed.  · You feel weak.  · You pass out (faint).  · You have chills.  · You are leaking fluid from your vagina.  · You have a gush of fluid from your vagina.  Summary  · Sometimes vaginal bleeding during pregnancy is normal and does not cause problems. At other times, bleeding may be a sign of something serious.  · Tell your doctor about any bleeding from your vagina right away.  · Follow your doctor's instructions about how active you can be. You may need someone to help you with your normal activities.  This information is not intended to replace advice given to  you by your health care provider. Make sure you discuss any questions you have with your health care provider.  Document Released: 08/09/2013 Document Revised: 06/26/2016 Document Reviewed: 06/26/2016  Elsevier Interactive Patient Education © 2019 [employer]

## 2018-05-08 LAB — RH IG WORKUP (INCLUDES ABO/RH)
ABO/RH(D): A NEG
ANTIBODY SCREEN: NEGATIVE
GESTATIONAL AGE(WKS): 12
Unit division: 0

## 2018-06-12 ENCOUNTER — Other Ambulatory Visit: Payer: Self-pay

## 2018-06-12 ENCOUNTER — Inpatient Hospital Stay (HOSPITAL_COMMUNITY)
Admission: AD | Admit: 2018-06-12 | Discharge: 2018-06-12 | Disposition: A | Discharge status: Home / Self Care | Payer: Medicaid Other | Attending: Obstetrics and Gynecology | Admitting: Obstetrics and Gynecology

## 2018-06-12 DIAGNOSIS — Z3A17 17 weeks gestation of pregnancy: Secondary | ICD-10-CM

## 2018-06-12 DIAGNOSIS — N841 Polyp of cervix uteri: Secondary | ICD-10-CM

## 2018-06-12 DIAGNOSIS — O4692 Antepartum hemorrhage, unspecified, second trimester: Secondary | ICD-10-CM | POA: Diagnosis not present

## 2018-06-12 DIAGNOSIS — O209 Hemorrhage in early pregnancy, unspecified: Secondary | ICD-10-CM | POA: Insufficient documentation

## 2018-06-12 DIAGNOSIS — O3442 Maternal care for other abnormalities of cervix, second trimester: Secondary | ICD-10-CM | POA: Diagnosis not present

## 2018-06-12 DIAGNOSIS — N9089 Other specified noninflammatory disorders of vulva and perineum: Secondary | ICD-10-CM

## 2018-06-12 LAB — URINALYSIS, ROUTINE W REFLEX MICROSCOPIC
BILIRUBIN URINE: NEGATIVE
GLUCOSE, UA: NEGATIVE mg/dL
Hgb urine dipstick: NEGATIVE
KETONES UR: NEGATIVE mg/dL
Leukocytes,Ua: NEGATIVE
NITRITE: NEGATIVE
PH: 7 (ref 5.0–8.0)
Protein, ur: NEGATIVE mg/dL
Specific Gravity, Urine: 1.006 (ref 1.005–1.030)

## 2018-06-12 NOTE — MAU Note (Addendum)
Since 2100 I have been passing blood clots and have abdominal cramping. Pt showed RN picture of vag d/c and it is brown and mucousy. Has not had intercourse recently

## 2018-06-12 NOTE — MAU Provider Note (Signed)
Chief Complaint: Vaginal Bleeding   First Provider Initiated Contact with Patient 06/12/18 2300     SUBJECTIVE HPI: Olivia Griffith is a 28 y.o. [id number] at [redacted]w[redacted]d who presents to Maternity Admissions reporting vaginal bleeding. Symptoms started this evening. Reports passing small brown clots. Not saturating pads. No bright red bleeding. Previously seen for VB during this pregnancy & diagnosed with cervical polyp. Some abdominal cramping. Last had intercourse 3 days ago.   Also reports lesion on left labia. States she cut herself shaving last week. Initially got better but has become painful again in the last few days. Denies hx of hsv.   Location: abdomen Quality: cramping Severity: 5/10 on pain scale Duration: 1 day Timing: intermittent Modifying factors: none Associated signs and symptoms: vaginal bleeding  Past Medical History:  Diagnosis Date  . Medical history non-contributory   . Obese    OB History  Gravida Para Term Preterm AB Living  4 2 2  0 1 2  SAB TAB Ectopic Multiple Live Births  0 1 0 0 2    # Outcome Date GA Lbr Len/2nd Weight Sex Delivery Anes PTL Lv  4 Current           3 Term 06/30/16 [redacted]w[redacted]d 11:01 / 00:40 3100 g F Vag-Spont EPI  LIV  2 Term 02/03/06    M Vag-Spont  N LIV  1 TAB            Past Surgical History:  Procedure Laterality Date  . NO PAST SURGERIES     Social History   Socioeconomic History  . Marital status: Single    Spouse name: Not on file  . Number of children: Not on file  . Years of education: Not on file  . Highest education level: Not on file  Occupational History  . Not on file  Social Needs  . Financial resource strain: Not on file  . Food insecurity:    Worry: Not on file    Inability: Not on file  . Transportation needs:    Medical: Not on file    Non-medical: Not on file  Tobacco Use  . Smoking status: Never Smoker  . Smokeless tobacco: Never Used  Substance and Sexual Activity  . Alcohol use: No  . Drug use: No  .  Sexual activity: Yes  Lifestyle  . Physical activity:    Days per week: Not on file    Minutes per session: Not on file  . Stress: Not on file  Relationships  . Social connections:    Talks on phone: Not on file    Gets together: Not on file    Attends religious service: Not on file    Active member of club or organization: Not on file    Attends meetings of clubs or organizations: Not on file    Relationship status: Not on file  . Intimate partner violence:    Fear of current or ex partner: Not on file    Emotionally abused: Not on file    Physically abused: Not on file    Forced sexual activity: Not on file  Other Topics Concern  . Not on file  Social History Narrative   ** Merged History Encounter **       No family history on file. No current facility-administered medications on file prior to encounter.    Current Outpatient Medications on File Prior to Encounter  Medication Sig Dispense Refill  . omeprazole (PRILOSEC) 20 MG capsule Take 1 capsule (20  mg total) by mouth daily. 30 capsule 0  . Prenatal Multivit-Min-Fe-FA (PRENATAL VITAMINS) 0.8 MG tablet Take 1 tablet by mouth daily. 30 tablet 12   Allergies  Allergen Reactions  . Demerol [Meperidine] Anaphylaxis  . Morphine And Related Rash    Itching and rash at administration site    I have reviewed patient's Past Medical Hx, Surgical Hx, Family Hx, Social Hx, medications and allergies.   Review of Systems  Constitutional: Negative.   Gastrointestinal: Positive for abdominal pain. Negative for constipation, diarrhea, nausea and vomiting.  Genitourinary: Positive for vaginal bleeding and vaginal pain. Negative for dysuria and vaginal discharge.    OBJECTIVE Patient Vitals for the past 24 hrs:  BP Temp Temp src Pulse Resp Height Weight  06/12/18 2314 130/75 98.5 F (36.9 C) Oral 90 17 - -  06/12/18 2229 123/80 98.5 F (36.9 C) - 90 18 5\' 7"  (1.702 m) (!) 142 kg   Constitutional: Well-developed,  well-nourished female in no acute distress.  Cardiovascular: normal rate & rhythm, no murmur Respiratory: normal rate and effort. Lung sounds clear throughout GI: Abd soft, non-tender, Pos BS x 4. No guarding or rebound tenderness MS: Extremities nontender, no edema, normal ROM Neurologic: Alert and oriented x 4.  GU:     SPECULUM EXAM: pinpoint coalescing vesicular lesions on left labia, small amount of tan mucoid discharge, no active bleeding. Cervical polyp visualized  BIMANUAL: No CMT. cervix closed/thick   LAB RESULTS Results for orders placed or performed during the hospital encounter of 06/12/18 (from the past 24 hour(s))  Urinalysis, Routine w reflex microscopic     Status: Abnormal   Collection Time: 06/12/18 10:57 PM  Result Value Ref Range   Color, Urine STRAW (A) YELLOW   APPearance CLEAR CLEAR   Specific Gravity, Urine 1.006 1.005 - 1.030   pH 7.0 5.0 - 8.0   Glucose, UA NEGATIVE NEGATIVE mg/dL   Hgb urine dipstick NEGATIVE NEGATIVE   Bilirubin Urine NEGATIVE NEGATIVE   Ketones, ur NEGATIVE NEGATIVE mg/dL   Protein, ur NEGATIVE NEGATIVE mg/dL   Nitrite NEGATIVE NEGATIVE   Leukocytes,Ua NEGATIVE NEGATIVE    IMAGING No results found.  MAU COURSE Orders Placed This Encounter  Procedures  . Hsv Culture And Typing  . Urinalysis, Routine w reflex microscopic  . Discharge patient   No orders of the defined types were placed in this encounter.   MDM FHT present via doppler  No active bleeding. Cervix closed/thick. RH negative, received rhogam 1/30.   Lesions suspicious for HSV. HSV culture obtained.   ASSESSMENT 1. Vaginal bleeding in pregnancy, second trimester   2. [redacted] weeks gestation of pregnancy   3. Labial lesion   4. Cervical polyp     PLAN Discharge home in stable condition. Bleeding precautions HSV culture pending F/u with OB  Follow-up Information    [facility] Follow up.   Contact information: [address]  [address] [place] [address] [phone]          Allergies as of 06/12/2018      Reactions   Demerol [meperidine] Anaphylaxis   Morphine And Related Rash   Itching and rash at administration site      Medication List    TAKE these medications   omeprazole 20 MG capsule Commonly known as:  PRILOSEC Take 1 capsule (20 mg total) by mouth daily.   Prenatal Vitamins 0.8 MG tablet Take 1 tablet by mouth daily.  Judeth Horn, NP 06/13/2018  12:43 AM

## 2018-06-12 NOTE — Discharge Instructions (Signed)
Vaginal Bleeding During Pregnancy, Second Trimester ° °A small amount of bleeding (spotting) from the vagina is relatively common during pregnancy. It usually stops on its own. Various things can cause spotting during pregnancy. Sometimes the bleeding is normal and is not a sign of a problem in the pregnancy. However, bleeding can also be a sign of something serious. Be sure to tell your health care provider about any vaginal bleeding right away. °Some possible causes of vaginal bleeding during the second trimester include: °· Infection, inflammation, or growths (polyps) on the cervix. °· A condition in which the placenta partially or completely covers the opening of the cervix inside the uterus (placenta previa). °· The placenta separating from the uterus (placenta abruption). °· Early (preterm) labor. °· The cervix opening and thinning before pregnancy is at term and before labor starts (cervical insufficiency). °· A mass of tissue developing in the uterus due to an egg being fertilized incorrectly (molar pregnancy). °Follow these instructions at home: °Activity °· Follow instructions from your health care provider about limiting your activity. Ask what activities are safe for you. °· If needed, make plans for someone to help with your regular activities. °· Do not exercise or do activities that take a lot of effort unless your health care provider approves. °· Do not lift anything that is heavier than 10 lb (4.5 kg), or the limit that your health care provider tells you, until he or she says that it is safe. °· Do not have sex or orgasms until your health care provider says that this is safe. °Medicines °· Take over-the-counter and prescription medicines only as told by your health care provider. °· Do not take aspirin because it can cause bleeding. °General instructions °· Pay attention to any changes in your symptoms. °· Write down how many pads you use each day, how often you change pads, and how soaked  (saturated) they are. °· Do not use tampons or douche. °· If you pass any tissue from your vagina, save the tissue so you can show it to your health care provider. °· Keep all follow-up visits as told by your health care provider. This is important. °Contact a health care provider if: °· You have vaginal bleeding during any time of your pregnancy. °· You have cramps or labor pains. °· You have a fever that does not get better when you take medicines. °Get help right away if: °· You have severe cramps in your back or abdomen. °· You have contractions. °· You have chills. °· You pass large clots or a large amount of tissue from your vagina. °· Your bleeding increases. °· You feel light-headed or weak, or you faint. °· You are leaking fluid or have a gush of fluid from your vagina. °Summary °· Various things can cause bleeding or spotting in pregnancy. °· Be sure to tell your health care provider about any vaginal bleeding right away. °· Follow instructions from your health care provider about limiting your activity. Ask what activities are safe for you. °This information is not intended to replace advice given to you by your health care provider. Make sure you discuss any questions you have with your health care provider. °Document Released: 01/02/2005 Document Revised: 06/27/2016 Document Reviewed: 06/27/2016 °Elsevier Interactive Patient Education © 2019 [employer] Inc[employer] ° °

## 2018-06-15 LAB — HSV CULTURE AND TYPING

## 2018-06-17 ENCOUNTER — Other Ambulatory Visit: Payer: Self-pay | Admitting: Student

## 2018-06-17 DIAGNOSIS — B009 Herpesviral infection, unspecified: Secondary | ICD-10-CM

## 2018-06-17 DIAGNOSIS — O98512 Other viral diseases complicating pregnancy, second trimester: Principal | ICD-10-CM

## 2018-06-17 MED ORDER — VALACYCLOVIR HCL 1 G PO TABS: 1000.0000 mg | ORAL_TABLET | Freq: Two times a day (BID) | ORAL | 0 refills | Status: DC

## 2018-06-27 ENCOUNTER — Encounter (HOSPITAL_COMMUNITY): Payer: Self-pay

## 2018-06-27 ENCOUNTER — Inpatient Hospital Stay (HOSPITAL_COMMUNITY)
Admission: AD | Admit: 2018-06-27 | Discharge: 2018-06-27 | Disposition: A | Discharge status: Home / Self Care | Payer: Medicaid Other | Attending: Obstetrics and Gynecology | Admitting: Obstetrics and Gynecology

## 2018-06-27 ENCOUNTER — Inpatient Hospital Stay (EMERGENCY_DEPARTMENT_HOSPITAL)
Admission: AD | Admit: 2018-06-27 | Discharge: 2018-06-27 | Disposition: A | Discharge status: Home / Self Care | Payer: Medicaid Other | Source: Home / Self Care | Attending: Obstetrics and Gynecology | Admitting: Obstetrics and Gynecology

## 2018-06-27 ENCOUNTER — Other Ambulatory Visit: Payer: Self-pay

## 2018-06-27 ENCOUNTER — Inpatient Hospital Stay (HOSPITAL_BASED_OUTPATIENT_CLINIC_OR_DEPARTMENT_OTHER): Payer: Medicaid Other

## 2018-06-27 DIAGNOSIS — O479 False labor, unspecified: Secondary | ICD-10-CM | POA: Diagnosis not present

## 2018-06-27 DIAGNOSIS — Z3A19 19 weeks gestation of pregnancy: Secondary | ICD-10-CM | POA: Insufficient documentation

## 2018-06-27 DIAGNOSIS — O99212 Obesity complicating pregnancy, second trimester: Secondary | ICD-10-CM | POA: Diagnosis not present

## 2018-06-27 DIAGNOSIS — R109 Unspecified abdominal pain: Secondary | ICD-10-CM | POA: Insufficient documentation

## 2018-06-27 DIAGNOSIS — O4702 False labor before 37 completed weeks of gestation, second trimester: Secondary | ICD-10-CM

## 2018-06-27 DIAGNOSIS — Z79899 Other long term (current) drug therapy: Secondary | ICD-10-CM | POA: Insufficient documentation

## 2018-06-27 DIAGNOSIS — O26892 Other specified pregnancy related conditions, second trimester: Secondary | ICD-10-CM | POA: Insufficient documentation

## 2018-06-27 DIAGNOSIS — N898 Other specified noninflammatory disorders of vagina: Secondary | ICD-10-CM | POA: Insufficient documentation

## 2018-06-27 DIAGNOSIS — O42919 Preterm premature rupture of membranes, unspecified as to length of time between rupture and onset of labor, unspecified trimester: Secondary | ICD-10-CM

## 2018-06-27 DIAGNOSIS — O4290 Premature rupture of membranes, unspecified as to length of time between rupture and onset of labor, unspecified weeks of gestation: Secondary | ICD-10-CM

## 2018-06-27 DIAGNOSIS — O42912 Preterm premature rupture of membranes, unspecified as to length of time between rupture and onset of labor, second trimester: Secondary | ICD-10-CM

## 2018-06-27 DIAGNOSIS — E669 Obesity, unspecified: Secondary | ICD-10-CM | POA: Diagnosis not present

## 2018-06-27 DIAGNOSIS — Z885 Allergy status to narcotic agent status: Secondary | ICD-10-CM | POA: Insufficient documentation

## 2018-06-27 LAB — URINALYSIS, ROUTINE W REFLEX MICROSCOPIC
BILIRUBIN URINE: NEGATIVE
Glucose, UA: NEGATIVE mg/dL
KETONES UR: NEGATIVE mg/dL
Nitrite: NEGATIVE
PH: 7 (ref 5.0–8.0)
Protein, ur: 100 mg/dL — AB
Specific Gravity, Urine: 1.003 — ABNORMAL LOW (ref 1.005–1.030)
WBC, UA: >50 WBC/hpf — ABNORMAL HIGH (ref 0–5)

## 2018-06-27 LAB — WET PREP, GENITAL
Clue Cells Wet Prep HPF POC: NONE SEEN
SPERM: NONE SEEN
Trich, Wet Prep: NONE SEEN
YEAST WET PREP: NONE SEEN

## 2018-06-27 LAB — AMNISURE RUPTURE OF MEMBRANE (ROM) NOT AT ARMC: Amnisure ROM: POSITIVE

## 2018-06-27 NOTE — MAU Provider Note (Signed)
History     CSN: [id number]  Arrival date and time: 06/27/18 2107   First Provider Initiated Contact with Patient 06/27/18 2130      Chief Complaint  Patient presents with  . Rupture of Membranes   HPI Olivia Griffith is a 28 y.o. [id number] at [redacted]w[redacted]d who presents to MAU with chief complaint of "big gush of fluid" and continuous leaking. Patient was discharge earlier this evening with plan for close outpatient follow-up after positive Amnisure. She reports large gush of fluid around 9pm, shortly after discharge. She also reports new onset abdominal cramping which began shortly after the gush of fluid. She has not taken medication or tried other treatments for this problem.  OB History    Gravida  4   Para  2   Term  2   Preterm  0   AB  1   Living  2     SAB  0   TAB  1   Ectopic  0   Multiple  0   Live Births  2           Past Medical History:  Diagnosis Date  . Medical history non-contributory   . Obese     Past Surgical History:  Procedure Laterality Date  . NO PAST SURGERIES      No family history on file.  Social History   Tobacco Use  . Smoking status: Never Smoker  . Smokeless tobacco: Never Used  Substance Use Topics  . Alcohol use: No  . Drug use: No    Allergies:  Allergies  Allergen Reactions  . Demerol [Meperidine] Anaphylaxis  . Morphine And Related Rash    Itching and rash at administration site    Medications Prior to Admission  Medication Sig Dispense Refill Last Dose  . omeprazole (PRILOSEC) 20 MG capsule Take 1 capsule (20 mg total) by mouth daily. 30 capsule 0   . Prenatal Multivit-Min-Fe-FA (PRENATAL VITAMINS) 0.8 MG tablet Take 1 tablet by mouth daily. 30 tablet 12 05/07/2018 at 0900  . valACYclovir (VALTREX) 1000 MG tablet Take 1 tablet (1,000 mg total) by mouth 2 (two) times daily. 20 tablet 0     Review of Systems  Constitutional: Negative for chills, fatigue and fever.  Respiratory: Negative for chest tightness  and shortness of breath.   Gastrointestinal: Positive for abdominal pain.  Genitourinary: Positive for vaginal discharge.  Musculoskeletal: Negative for back pain.  Neurological: Negative for syncope, weakness and headaches.  All other systems reviewed and are negative.  Physical Exam   Blood pressure 128/78, pulse (!) 120, temperature 99 F (37.2 C), resp. rate 18, last menstrual period 01/08/2018, unknown if currently breastfeeding.  Physical Exam  Nursing note and vitals reviewed. Constitutional: She is oriented to person, place, and time. She appears well-developed and well-nourished.  Cardiovascular: Normal rate.  Respiratory: Effort normal. No respiratory distress.  GI: There is no abdominal tenderness. There is no rebound, no guarding and no CVA tenderness.  Musculoskeletal: Normal range of motion.  Neurological: She is alert and oriented to person, place, and time.  Skin: Skin is warm and dry.  Psychiatric: She has a normal mood and affect. Her behavior is normal. Judgment and thought content normal.    MAU Course/MDM  Procedures: sterile speculum exam  --Amniotic fluid visible on external genitalia --Grossly ruptured, large amounts of fluid with streaks of green and brown with insertion of speculum --Cervix closed --Dr. Ellyn Hack at bedside to discuss options for plan of care  as well as risk factors.  --Patient declines chaplain --Greater than 45 minutes spent at bedside clarifying questions posed by patient and [name]. Patient given extensive review of signs and symptoms associated with worsening acuity and criteria for returning to MAU  Patient Vitals for the past 24 hrs:  BP Temp Pulse Resp  06/27/18 2114 128/78 99 F (37.2 C) (!) 120 18     Assessment and Plan  --28 y.o. [id number] with PPROM at [redacted]w[redacted]d  --FHT 160 --Per patient preference, patient discharged home in stable condition for expectant management  F/U:  Patient to be seen in clinic early next week. Patient  to take her oral temperature q 4 hours when awake, per Dr. Ruthann Cancer, CNM 06/28/2018, 12:08 AM

## 2018-06-27 NOTE — MAU Provider Note (Signed)
Chief Complaint: Rupture of Membranes   First Provider Initiated Contact with Patient 06/27/18 1645      SUBJECTIVE HPI: Olivia Griffith is a 28 y.o. [id number] at [redacted]w[redacted]d who presents to maternity admissions reporting gush of fluid at home soaking through her pantyliner and onto her clothes. She has not required a pad for the leakage. It was clear watery fluid with some brown mixed in.  There is some constant lower pelvic pressure since the leakage. There are no other symptoms. She has not tried any treatments.   HPI  Past Medical History:  Diagnosis Date  . Medical history non-contributory   . Obese    Past Surgical History:  Procedure Laterality Date  . NO PAST SURGERIES     Social History   Socioeconomic History  . Marital status: Single    Spouse name: Not on file  . Number of children: Not on file  . Years of education: Not on file  . Highest education level: Not on file  Occupational History  . Not on file  Social Needs  . Financial resource strain: Not on file  . Food insecurity:    Worry: Not on file    Inability: Not on file  . Transportation needs:    Medical: Not on file    Non-medical: Not on file  Tobacco Use  . Smoking status: Never Smoker  . Smokeless tobacco: Never Used  Substance and Sexual Activity  . Alcohol use: No  . Drug use: No  . Sexual activity: Yes  Lifestyle  . Physical activity:    Days per week: Not on file    Minutes per session: Not on file  . Stress: Not on file  Relationships  . Social connections:    Talks on phone: Not on file    Gets together: Not on file    Attends religious service: Not on file    Active member of club or organization: Not on file    Attends meetings of clubs or organizations: Not on file    Relationship status: Not on file  . Intimate partner violence:    Fear of current or ex partner: Not on file    Emotionally abused: Not on file    Physically abused: Not on file    Forced sexual activity: Not on file   Other Topics Concern  . Not on file  Social History Narrative   ** Merged History Encounter **       No current facility-administered medications on file prior to encounter.    Current Outpatient Medications on File Prior to Encounter  Medication Sig Dispense Refill  . omeprazole (PRILOSEC) 20 MG capsule Take 1 capsule (20 mg total) by mouth daily. 30 capsule 0  . Prenatal Multivit-Min-Fe-FA (PRENATAL VITAMINS) 0.8 MG tablet Take 1 tablet by mouth daily. 30 tablet 12  . valACYclovir (VALTREX) 1000 MG tablet Take 1 tablet (1,000 mg total) by mouth 2 (two) times daily. 20 tablet 0   Allergies  Allergen Reactions  . Demerol [Meperidine] Anaphylaxis  . Morphine And Related Rash    Itching and rash at administration site    ROS:  Review of Systems  Constitutional: Negative for chills, fatigue and fever.  Eyes: Negative for visual disturbance.  Respiratory: Negative for shortness of breath.   Cardiovascular: Negative for chest pain.  Gastrointestinal: Negative for abdominal pain, nausea and vomiting.  Genitourinary: Positive for pelvic pain and vaginal discharge. Negative for difficulty urinating, dysuria, flank pain, vaginal bleeding and vaginal  pain.  Neurological: Negative for dizziness and headaches.  Psychiatric/Behavioral: Negative.      I have reviewed patient's Past Medical Hx, Surgical Hx, Family Hx, Social Hx, medications and allergies.   Physical Exam   Patient Vitals for the past 24 hrs:  BP Temp Temp src Pulse Resp SpO2  06/27/18 1548 122/68 98.7 F (37.1 C) Oral (!) 114 18 100 %   Constitutional: Well-developed, well-nourished female in no acute distress.  Cardiovascular: normal rate Respiratory: normal effort GI: Abd soft, non-tender. Pos BS x 4 MS: Extremities nontender, no edema, normal ROM Neurologic: Alert and oriented x 4.  GU: Neg CVAT.  PELVIC EXAM: Cervix pink, visually closed, without lesion, small amount milky fluid with mucus, no pooling noted,  no fluid with valsalva, no bleeding visualized, vaginal walls and external genitalia normal  FHT 160 by doppler  LAB RESULTS Results for orders placed or performed during the hospital encounter of 06/27/18 (from the past 24 hour(s))  Urinalysis, Routine w reflex microscopic     Status: Abnormal   Collection Time: 06/27/18  3:56 PM  Result Value Ref Range   Color, Urine YELLOW YELLOW   APPearance CLOUDY (A) CLEAR   Specific Gravity, Urine 1.003 (L) 1.005 - 1.030   pH 7.0 5.0 - 8.0   Glucose, UA NEGATIVE NEGATIVE mg/dL   Hgb urine dipstick LARGE (A) NEGATIVE   Bilirubin Urine NEGATIVE NEGATIVE   Ketones, ur NEGATIVE NEGATIVE mg/dL   Protein, ur 407 (A) NEGATIVE mg/dL   Nitrite NEGATIVE NEGATIVE   Leukocytes,Ua LARGE (A) NEGATIVE   RBC / HPF 0-5 0 - 5 RBC/hpf   WBC, UA >50 (H) 0 - 5 WBC/hpf   Bacteria, UA MANY (A) NONE SEEN   Squamous Epithelial / LPF 21-50 0 - 5   WBC Clumps PRESENT   Amnisure rupture of membrane (rom)not at [facility]     Status: None   Collection Time: 06/27/18  4:40 PM  Result Value Ref Range   Amnisure ROM POSITIVE   Wet prep, genital     Status: Abnormal   Collection Time: 06/27/18  4:40 PM  Result Value Ref Range   Yeast Wet Prep HPF POC NONE SEEN NONE SEEN   Trich, Wet Prep NONE SEEN NONE SEEN   Clue Cells Wet Prep HPF POC NONE SEEN NONE SEEN   WBC, Wet Prep HPF POC FEW (A) NONE SEEN   Sperm NONE SEEN     --/--/A NEG (01/30 1331)  IMAGING No results found.  MAU Management/MDM: Orders Placed This Encounter  Procedures  . Wet prep, genital  . OB Urine Culture  . [place] MFM OB LIMITED  . Urinalysis, Routine w reflex microscopic  . Amnisure rupture of membrane (rom)not at [facility]  . Discharge patient    No orders of the defined types were placed in this encounter.   Pelvic exam with no pooling, no ferning on slide taken during exam.  Amnisure sent and resulted positive.  [place] to evaluate amniotic fluid and results showing normal fluid, largest pocket >6 cm.   Consult Dr Jolayne Panther with assessment and findings.  With all normal testing except amnisure, and pt report of less leakage/discharge while in MAU, recommend close outpatient follow up.  Contacted [facility] office and office to schedule follow up ultrasound this week.  Precautions/reasons to return to MAU reviewed with pt. Note for pt to miss work x 2 days.  Pt planning to work from home due to coronovirus starting on 06/30/18 so will return  to work at home only.   Pt discharged with strict return precautions.  ASSESSMENT 1. Vaginal discharge during pregnancy in second trimester   2. Abdominal pain during pregnancy in second trimester   3. Threatened preterm labor, second trimester     PLAN Discharge home Allergies as of 06/27/2018      Reactions   Demerol [meperidine] Anaphylaxis   Morphine And Related Rash   Itching and rash at administration site      Medication List    TAKE these medications   omeprazole 20 MG capsule Commonly known as:  PRILOSEC Take 1 capsule (20 mg total) by mouth daily.   Prenatal Vitamins 0.8 MG tablet Take 1 tablet by mouth daily.   valACYclovir 1000 MG tablet Commonly known as:  VALTREX Take 1 tablet (1,000 mg total) by mouth 2 (two) times daily.      Follow-up Information    Huel Cote, MD Follow up.   Specialty:  Obstetrics and Gynecology Why:  The office will call you or follow up Monday, 06/29/18, to schedule outpatient ultrasound this week. Return to MAU as needed for emergencies. Contact information: [address] [place] [address] [phone]           Sharen Counter Certified Nurse-Midwife 06/27/2018  8:09 PM

## 2018-06-27 NOTE — Discharge Instructions (Signed)
Sepsis, Diagnosis, Adult  Sepsis is a serious bodily reaction to an infection. The infection that triggers sepsis may be from a bacteria, virus, or fungus. Sepsis can result from an infection in any part of your body. Infections that commonly lead to sepsis include skin, lung, and urinary tract infections.  Sepsis is a medical emergency that must be treated right away in a hospital. In severe cases, it can lead to septic shock. Septic shock can weaken your heart and cause your blood pressure to drop. This can cause your central nervous system and your body's organs to stop working.  What are the causes?  This condition is caused by a severe reaction to infections from bacteria, viruses, or fungus. The germs that most often lead to sepsis include:  · Escherichia coli (E. coli) bacteria.  · Staphylococcus aureus (staph) bacteria.  · Some types of Streptococcus bacteria.  The most common infections affect these organs:  · The lung (pneumonia).  · The kidneys or bladder (urinary tract infection).  · The skin (cellulitis).  · The bowel, gallbladder, or pancreas.  What increases the risk?  You are more likely to develop this condition if:  · Your body's disease-fighting system (immune system) is weakened.  · You are age 65 or older.  · You are female.  · You had surgery or you have been hospitalized.  · You have these devices inserted into your body:  ? A small, thin tube (catheter).  ? IV line.  ? Breathing tube.  ? Drainage tube.  · You are not getting enough nutrients from food (malnourished).  · You have a long-term (chronic) disease, such as cancer, lung disease, kidney disease, or diabetes.  · You are African American.  What are the signs or symptoms?  Symptoms of this condition may include:  · Fever.  · Chills or feeling very cold.  · Confusion or anxiety.  · Fatigue.  · Muscle aches.  · Shortness of breath.  · Nausea and vomiting.  · Urinating much less than usual.  · Fast heart rate (tachycardia).  · Rapid  breathing (hyperventilation).  · Changes in skin color. Your skin may look blotchy, pale, or blue.  · Cool, clammy, or sweaty skin.  · Skin rash.  Other symptoms depend on the source of your infection.  How is this diagnosed?  This condition is diagnosed based on:  · Your symptoms.  · Your medical history.  · A physical exam.  Other tests may also be done to find out the cause of the infection and how severe the sepsis is. These tests may include:  · Blood tests.  · Urine tests.  · Swabs from other areas of your body that may have an infection. These samples may be tested (cultured) to find out what type of bacteria is causing the infection.  · Chest X-ray to check for pneumonia. Other imaging tests, such as a CT scan, may also be done.  · Lumbar puncture. This removes a small amount of the fluid that surrounds your brain and spinal cord. The fluid is then examined for infection.  How is this treated?  This condition must be treated in a hospital. Based on the cause of your infection, you may be given an antibiotic, antiviral, or antifungal medicine.  You may also receive:  · Fluids through an IV.  · Oxygen and breathing assistance.  · Medicines to increase your blood pressure.  · Kidney dialysis. This process cleans your blood if your   kidneys have failed.  · Surgery to remove infected tissue.  · Blood transfusion if needed.  · Medicine to prevent blood clots.  · Nutrients to correct imbalances in basic body function (metabolism). You may:  ? Receive important salts and minerals (electrolytes) through an IV.  ? Have your blood sugar level adjusted.  Follow these instructions at home:  Medicines    · Take over-the-counter and prescription medicines only as told by your health care provider.  · If you were prescribed an antibiotic, antiviral, or antifungal medicine, take it as told by your health care provider. Do not stop taking the medicine even if you start to feel better.  General instructions  · If you have a  catheter or other indwelling device, ask to have it removed as soon as possible.  · Keep all follow-up visits as told by your health care provider. This is important.  Contact a health care provider if:  · You do not feel like you are getting better or regaining strength.  · You are having trouble coping with your recovery.  · You frequently feel tired.  · You feel worse or do not seem to get better after surgery.  · You think you may have an infection after surgery.  Get help right away if:  · You have any symptoms of sepsis.  · You have difficulty breathing.  · You have a rapid or skipping heartbeat.  · You become confused or disoriented.  · You have a high fever.  · Your skin becomes blotchy, pale, or blue.  · You have an infection that is getting worse or not getting better.  These symptoms may represent a serious problem that is an emergency. Do not wait to see if the symptoms will go away. Get medical help right away. Call your local emergency services (911 in the [place] Do not drive yourself to the hospital.  Summary  · Sepsis is a medical emergency that requires immediate treatment in a hospital.  · This condition is caused by a severe reaction to infections from bacteria, viruses, or fungus.  · Based on the cause of your infection, you may be given an antibiotic, antiviral, or antifungal medicine.  · Treatment may also include IV fluids, breathing assistance, and kidney dialysis.  This information is not intended to replace advice given to you by your health care provider. Make sure you discuss any questions you have with your health care provider.  Document Released: 12/22/2002 Document Revised: 10/31/2017 Document Reviewed: 10/31/2017  Elsevier Interactive Patient Education © 2019 [employer]

## 2018-06-27 NOTE — MAU Note (Signed)
Olivia Griffith is a 28 y.o. at [redacted]w[redacted]d here in MAU reporting: LOF about 2 hours ago, felt a gush and is now just feeling some trickling, said she soaked a panty liner. First gush had a brown tint but now is clear. Having lower abdominal pressure but it is not painful. Scant bleeding. No recent IC  Onset of complaint: 2 hours ago  Pain score: 0/10  Vitals:   06/27/18 1548  BP: 122/68  Pulse: (!) 114  Resp: 18  Temp: 98.7 F (37.1 C)  SpO2: 100%     FHT:160  Lab orders placed from triage: UA

## 2018-06-27 NOTE — MAU Note (Signed)
Pt reprots more fluid is coming out and more cramping started as well.

## 2018-06-29 LAB — CULTURE, OB URINE: Culture: >=100000 — AB

## 2018-06-29 LAB — GC/CHLAMYDIA PROBE AMP (~~LOC~~) NOT AT ARMC
Chlamydia: NEGATIVE
Neisseria Gonorrhea: NEGATIVE

## 2018-07-01 ENCOUNTER — Other Ambulatory Visit: Payer: Self-pay

## 2018-07-01 ENCOUNTER — Inpatient Hospital Stay (HOSPITAL_COMMUNITY): Payer: Medicaid Other | Admitting: Anesthesiology

## 2018-07-01 ENCOUNTER — Inpatient Hospital Stay (HOSPITAL_COMMUNITY)
Admission: AD | Admit: 2018-07-01 | Discharge: 2018-07-03 | DRG: 807 | Disposition: A | Discharge status: Home / Self Care | Payer: Medicaid Other | Attending: Obstetrics and Gynecology | Admitting: Obstetrics and Gynecology

## 2018-07-01 DIAGNOSIS — Z3A20 20 weeks gestation of pregnancy: Secondary | ICD-10-CM

## 2018-07-01 DIAGNOSIS — O26892 Other specified pregnancy related conditions, second trimester: Secondary | ICD-10-CM | POA: Diagnosis not present

## 2018-07-01 DIAGNOSIS — O321XX0 Maternal care for breech presentation, not applicable or unspecified: Secondary | ICD-10-CM | POA: Diagnosis present

## 2018-07-01 DIAGNOSIS — O42112 Preterm premature rupture of membranes, onset of labor more than 24 hours following rupture, second trimester: Secondary | ICD-10-CM | POA: Diagnosis present

## 2018-07-01 DIAGNOSIS — O24112 Pre-existing diabetes mellitus, type 2, in pregnancy, second trimester: Secondary | ICD-10-CM

## 2018-07-01 DIAGNOSIS — R109 Unspecified abdominal pain: Secondary | ICD-10-CM

## 2018-07-01 DIAGNOSIS — O364XX0 Maternal care for intrauterine death, not applicable or unspecified: Principal | ICD-10-CM | POA: Diagnosis present

## 2018-07-01 DIAGNOSIS — O99214 Obesity complicating childbirth: Secondary | ICD-10-CM | POA: Diagnosis present

## 2018-07-01 LAB — CBC WITH DIFFERENTIAL/PLATELET
ABS IMMATURE GRANULOCYTES: 0.05 10*3/uL (ref 0.00–0.07)
BASOS ABS: 0 10*3/uL (ref 0.0–0.1)
Basophils Relative: 0 %
Eosinophils Absolute: 0.2 10*3/uL (ref 0.0–0.5)
Eosinophils Relative: 1 %
HCT: 38.9 % (ref 36.0–46.0)
HEMOGLOBIN: 13.3 g/dL (ref 12.0–15.0)
Immature Granulocytes: 0 %
LYMPHS PCT: 13 %
Lymphs Abs: 1.8 10*3/uL (ref 0.7–4.0)
MCH: 30.3 pg (ref 26.0–34.0)
MCHC: 34.2 g/dL (ref 30.0–36.0)
MCV: 88.6 fL (ref 80.0–100.0)
Monocytes Absolute: 0.6 10*3/uL (ref 0.1–1.0)
Monocytes Relative: 4 %
NEUTROS ABS: 11.1 10*3/uL — AB (ref 1.7–7.7)
NEUTROS PCT: 82 %
NRBC: 0 % (ref 0.0–0.2)
Platelets: 198 10*3/uL (ref 150–400)
RBC: 4.39 MIL/uL (ref 3.87–5.11)
RDW: 14.5 % (ref 11.5–15.5)
WBC: 13.7 10*3/uL — AB (ref 4.0–10.5)

## 2018-07-01 LAB — ABO/RH: ABO/RH(D): A NEG

## 2018-07-01 MED ORDER — MISOPROSTOL 200 MCG PO TABS
800.0000 ug | ORAL_TABLET | Freq: Three times a day (TID) | ORAL | Status: DC
  Administered 2018-07-01 – 2018-07-02 (×2): 800 ug via VAGINAL
  Filled 2018-07-01: qty 8
  Filled 2018-07-01 (×2): qty 4

## 2018-07-01 MED ORDER — LACTATED RINGERS IV SOLN: 500.0000 mL | INTRAVENOUS | Status: DC | PRN

## 2018-07-01 MED ORDER — OXYTOCIN BOLUS FROM INFUSION: 500.0000 mL | Freq: Once | INTRAVENOUS | Status: DC

## 2018-07-01 MED ORDER — BUTORPHANOL TARTRATE 1 MG/ML IJ SOLN: 1.0000 mg | INTRAMUSCULAR | Status: DC | PRN

## 2018-07-01 MED ORDER — ACETAMINOPHEN 325 MG PO TABS: 650.0000 mg | ORAL_TABLET | ORAL | Status: DC | PRN

## 2018-07-01 MED ORDER — LIDOCAINE-EPINEPHRINE (PF) 2 %-1:200000 IJ SOLN
INTRAMUSCULAR | Status: DC | PRN
  Administered 2018-07-01 (×2): 3 mL via EPIDURAL

## 2018-07-01 MED ORDER — KETOROLAC TROMETHAMINE 60 MG/2ML IM SOLN
60.0000 mg | Freq: Once | INTRAMUSCULAR | Status: AC
  Administered 2018-07-01: 60 mg via INTRAMUSCULAR
  Filled 2018-07-01: qty 2

## 2018-07-01 MED ORDER — SODIUM CHLORIDE (PF) 0.9 % IJ SOLN
INTRAMUSCULAR | Status: DC | PRN
  Administered 2018-07-01: 12 mL/h via EPIDURAL

## 2018-07-01 MED ORDER — LACTATED RINGERS IV SOLN
500.0000 mL | Freq: Once | INTRAVENOUS | Status: AC
  Administered 2018-07-01: 500 mL via INTRAVENOUS

## 2018-07-01 MED ORDER — DIPHENHYDRAMINE HCL 50 MG/ML IJ SOLN: 12.5000 mg | INTRAMUSCULAR | Status: DC | PRN

## 2018-07-01 MED ORDER — OXYCODONE-ACETAMINOPHEN 5-325 MG PO TABS: 2.0000 | ORAL_TABLET | ORAL | Status: DC | PRN

## 2018-07-01 MED ORDER — LIDOCAINE HCL (PF) 1 % IJ SOLN: 30.0000 mL | INTRAMUSCULAR | Status: DC | PRN

## 2018-07-01 MED ORDER — ONDANSETRON HCL 4 MG/2ML IJ SOLN: 4.0000 mg | Freq: Four times a day (QID) | INTRAMUSCULAR | Status: DC | PRN

## 2018-07-01 MED ORDER — OXYTOCIN 40 UNITS IN NORMAL SALINE INFUSION - SIMPLE MED
2.5000 [IU]/h | INTRAVENOUS | Status: DC
  Filled 2018-07-01: qty 1000

## 2018-07-01 MED ORDER — EPHEDRINE 5 MG/ML INJ
10.0000 mg | INTRAVENOUS | Status: DC | PRN
  Filled 2018-07-01: qty 2

## 2018-07-01 MED ORDER — ONDANSETRON 4 MG PO TBDP
4.0000 mg | ORAL_TABLET | Freq: Once | ORAL | Status: AC
  Administered 2018-07-01: 4 mg via ORAL
  Filled 2018-07-01: qty 1

## 2018-07-01 MED ORDER — FENTANYL-BUPIVACAINE-NACL 0.5-0.125-0.9 MG/250ML-% EP SOLN
12.0000 mL/h | EPIDURAL | Status: DC | PRN
  Filled 2018-07-01 (×3): qty 250

## 2018-07-01 MED ORDER — PHENYLEPHRINE 40 MCG/ML (10ML) SYRINGE FOR IV PUSH (FOR BLOOD PRESSURE SUPPORT)
80.0000 ug | PREFILLED_SYRINGE | INTRAVENOUS | Status: DC | PRN
  Filled 2018-07-01 (×2): qty 10

## 2018-07-01 MED ORDER — LACTATED RINGERS IV SOLN
INTRAVENOUS | Status: DC
  Administered 2018-07-01 – 2018-07-02 (×3): via INTRAVENOUS

## 2018-07-01 MED ORDER — SOD CITRATE-CITRIC ACID 500-334 MG/5ML PO SOLN: 30.0000 mL | ORAL | Status: DC | PRN

## 2018-07-01 MED ORDER — OXYCODONE-ACETAMINOPHEN 5-325 MG PO TABS: 1.0000 | ORAL_TABLET | ORAL | Status: DC | PRN

## 2018-07-01 MED ORDER — PHENYLEPHRINE 40 MCG/ML (10ML) SYRINGE FOR IV PUSH (FOR BLOOD PRESSURE SUPPORT)
80.0000 ug | PREFILLED_SYRINGE | INTRAVENOUS | Status: DC | PRN
  Filled 2018-07-01: qty 10

## 2018-07-01 NOTE — MAU Note (Signed)
Pt PPROM the other, went home to wait for viability. Pt started having pain 8 minutes ago. 8/10. Pt very uncomfortable. Arrived EMS>

## 2018-07-01 NOTE — MAU Provider Note (Signed)
History     CSN: [id number]  Arrival date and time: 07/01/18 4585   First Provider Initiated Contact with Patient 07/01/18 1929      Chief Complaint  Patient presents with  . Abdominal Pain   HPI Raiyn Bevill is a 28 y.o. [id number] at [redacted]w[redacted]d who presents to MAU via EMS with chief complaint of 10/10 abdominal pain and urge to push. She PPROMd 06/27/18 at about 9pm and was discharged from MAU that day for expectant management with plan to be seen in clinic tomorrow.  She also complains of new onset abdominal pain. She denies SOB, syncope, fever, or back pain.  OB History    Gravida  4   Para  2   Term  2   Preterm  0   AB  1   Living  2     SAB  0   TAB  1   Ectopic  0   Multiple  0   Live Births  2           Past Medical History:  Diagnosis Date  . Medical history non-contributory   . Obese     Past Surgical History:  Procedure Laterality Date  . NO PAST SURGERIES      No family history on file.  Social History   Tobacco Use  . Smoking status: Never Smoker  . Smokeless tobacco: Never Used  Substance Use Topics  . Alcohol use: No  . Drug use: No    Allergies:  Allergies  Allergen Reactions  . Demerol [Meperidine] Anaphylaxis  . Morphine And Related Rash    Itching and rash at administration site    Medications Prior to Admission  Medication Sig Dispense Refill Last Dose  . omeprazole (PRILOSEC) 20 MG capsule Take 1 capsule (20 mg total) by mouth daily. 30 capsule 0   . Prenatal Multivit-Min-Fe-FA (PRENATAL VITAMINS) 0.8 MG tablet Take 1 tablet by mouth daily. 30 tablet 12 05/07/2018 at 0900  . valACYclovir (VALTREX) 1000 MG tablet Take 1 tablet (1,000 mg total) by mouth 2 (two) times daily. 20 tablet 0     Review of Systems  Constitutional: Negative for chills and fatigue.  Respiratory: Negative for shortness of breath.   Cardiovascular: Negative for chest pain.  Gastrointestinal: Positive for abdominal pain.  Genitourinary:  Negative for vaginal bleeding, vaginal discharge and vaginal pain.  Musculoskeletal: Negative for back pain.  Neurological: Negative for headaches.  All other systems reviewed and are negative.  Physical Exam   Blood pressure 112/70, pulse (!) 107, temperature 98 F (36.7 C), temperature source Oral, resp. rate (!) 22, last menstrual period 01/08/2018, unknown if currently breastfeeding.  Physical Exam  Nursing note and vitals reviewed. Constitutional: She is oriented to person, place, and time. She appears well-developed and well-nourished.  Cardiovascular: Normal rate.  GI: There is abdominal tenderness. There is guarding. There is no rebound.  Suprapubic tenderness  Neurological: She is alert and oriented to person, place, and time.  Skin: Skin is warm and dry.  Psychiatric: She has a normal mood and affect. Her behavior is normal. Judgment and thought content normal.    MAU Course  Procedures:sterile speculum exam  --Small amount of brownish green discharge visible in vaginal vault suspicious for meconium --Cord visibly protruding through cervical os. Cord not pulsating --Dr. Jackelyn Knife called and given report. Bedside ultrasound conducted per his request --Good view of fetal heart obtained with bedside ultrasound. No cardiac activity visualized --Patient desires epidural as soon as  possible. Agrees to having some labs collected in MAU to facilitate this. Understands she may need second stick if additional labs are requested by admitting Provider  Patient Vitals for the past 24 hrs:  BP Temp Temp src Pulse Resp  07/01/18 1915 112/70 - - (!) 107 -  07/01/18 1914 - 98 F (36.7 C) Oral (!) 107 (!) 22    Orders Placed This Encounter  Procedures  . CBC with Differential/Platelet  . Cervical Exam  . If Rapid HIV test positive or known HIV positive: initiate AZT orders  . ABO/Rh  . Type and screen [facility]   Meds ordered this encounter  Medications  .  ketorolac (TORADOL) injection 60 mg  . ondansetron (ZOFRAN-ODT) disintegrating tablet 4 mg    Assessment and Plan  --28 y.o. [id number] at [redacted]w[redacted]d  --S/p PPROM 06/27/2018 at 9pm --IUFD --Admit to labor and delivery per consult with Dr. Estill Cotta, CNM 07/01/2018, 7:58 PM

## 2018-07-01 NOTE — Anesthesia Procedure Notes (Signed)
Epidural Patient location during procedure: OB Start time: 07/01/2018 9:05 PM End time: 07/01/2018 9:20 PM  Staffing Anesthesiologist: Elmer Picker, MD Performed: anesthesiologist   Preanesthetic Checklist Completed: patient identified, pre-op evaluation, timeout performed, IV checked, risks and benefits discussed and monitors and equipment checked  Epidural Patient position: sitting Prep: site prepped and draped and DuraPrep Patient monitoring: continuous pulse ox, blood pressure, heart rate and cardiac monitor Approach: midline Location: L3-L4 Injection technique: LOR air  Needle:  Needle type: Tuohy  Needle gauge: 17 G Needle length: 9 cm Needle insertion depth: 8 cm Catheter type: closed end flexible Catheter size: 19 Gauge Catheter at skin depth: 15 cm Test dose: negative  Assessment Sensory level: T8 Events: blood not aspirated, injection not painful, no injection resistance, negative IV test and no paresthesia  Additional Notes Patient identified. Risks/Benefits/Options discussed with patient including but not limited to bleeding, infection, nerve damage, paralysis, failed block, incomplete pain control, headache, blood pressure changes, nausea, vomiting, reactions to medication both or allergic, itching and postpartum back pain. Confirmed with bedside nurse the patient's most recent platelet count. Confirmed with patient that they are not currently taking any anticoagulation, have any bleeding history or any family history of bleeding disorders. Patient expressed understanding and wished to proceed. All questions were answered. Sterile technique was used throughout the entire procedure. Please see nursing notes for vital signs. Test dose was given through epidural catheter and negative prior to continuing to dose epidural or start infusion. Warning signs of high block given to the patient including shortness of breath, tingling/numbness in hands, complete motor block,  or any concerning symptoms with instructions to call for help. Patient was given instructions on fall risk and not to get out of bed. All questions and concerns addressed with instructions to call with any issues or inadequate analgesia.  Reason for block:procedure for pain

## 2018-07-01 NOTE — Anesthesia Preprocedure Evaluation (Signed)
Anesthesia Evaluation  Patient identified by MRN, date of birth, ID band Patient awake    Reviewed: Allergy & Precautions, NPO status , Patient's Chart, lab work & pertinent test results  Airway Mallampati: III  TM Distance: >3 FB Neck ROM: Full    Dental no notable dental hx.    Pulmonary neg pulmonary ROS,    Pulmonary exam normal breath sounds clear to auscultation       Cardiovascular negative cardio ROS Normal cardiovascular exam Rhythm:Regular Rate:Normal     Neuro/Psych negative neurological ROS  negative psych ROS   GI/Hepatic negative GI ROS, Neg liver ROS,   Endo/Other  Morbid obesity  Renal/GU negative Renal ROS  negative genitourinary   Musculoskeletal negative musculoskeletal ROS (+)   Abdominal   Peds negative pediatric ROS (+)  Hematology negative hematology ROS (+)   Anesthesia Other Findings IUFD at 20/1 weeks  Reproductive/Obstetrics (+) Pregnancy                             Anesthesia Physical Anesthesia Plan  ASA: III  Anesthesia Plan: Epidural   Post-op Pain Management:    Induction:   PONV Risk Score and Plan: Treatment may vary due to age or medical condition  Airway Management Planned: Natural Airway  Additional Equipment:   Intra-op Plan:   Post-operative Plan:   Informed Consent: I have reviewed the patients History and Physical, chart, labs and discussed the procedure including the risks, benefits and alternatives for the proposed anesthesia with the patient or authorized representative who has indicated his/her understanding and acceptance.       Plan Discussed with: Anesthesiologist  Anesthesia Plan Comments: (Patient identified. Risks, benefits, options discussed with patient including but not limited to bleeding, infection, nerve damage, paralysis, failed block, incomplete pain control, headache, blood pressure changes, nausea, vomiting,  reactions to medication, itching, and post partum back pain. Confirmed with bedside nurse the patient's most recent platelet count. Confirmed with the patient that they are not taking any anticoagulation, have any bleeding history or any family history of bleeding disorders. Patient expressed understanding and wishes to proceed. All questions were answered. )        Anesthesia Quick Evaluation

## 2018-07-02 ENCOUNTER — Encounter (HOSPITAL_COMMUNITY): Payer: Self-pay | Admitting: *Deleted

## 2018-07-02 LAB — KLEIHAUER-BETKE STAIN
# Vials RhIg: 1
Fetal Cells %: 0.1 %
Quantitation Fetal Hemoglobin: 5 mL

## 2018-07-02 MED ORDER — ACETAMINOPHEN 325 MG PO TABS: 650.0000 mg | ORAL_TABLET | ORAL | Status: DC | PRN

## 2018-07-02 MED ORDER — OXYCODONE HCL 5 MG PO TABS
10.0000 mg | ORAL_TABLET | ORAL | Status: DC | PRN
  Administered 2018-07-02: 10 mg via ORAL
  Filled 2018-07-02: qty 2

## 2018-07-02 MED ORDER — BUTORPHANOL TARTRATE 1 MG/ML IJ SOLN
INTRAMUSCULAR | Status: AC
  Filled 2018-07-02: qty 2

## 2018-07-02 MED ORDER — ONDANSETRON HCL 4 MG PO TABS: 4.0000 mg | ORAL_TABLET | ORAL | Status: DC | PRN

## 2018-07-02 MED ORDER — WITCH HAZEL-GLYCERIN EX PADS: 1.0000 "application " | MEDICATED_PAD | CUTANEOUS | Status: DC | PRN

## 2018-07-02 MED ORDER — COCONUT OIL OIL: 1.0000 "application " | TOPICAL_OIL | Status: DC | PRN

## 2018-07-02 MED ORDER — DIPHENHYDRAMINE HCL 25 MG PO CAPS: 25.0000 mg | ORAL_CAPSULE | Freq: Four times a day (QID) | ORAL | Status: DC | PRN

## 2018-07-02 MED ORDER — SENNOSIDES-DOCUSATE SODIUM 8.6-50 MG PO TABS
2.0000 | ORAL_TABLET | ORAL | Status: DC
  Administered 2018-07-02: 2 via ORAL
  Filled 2018-07-02: qty 2

## 2018-07-02 MED ORDER — OXYCODONE HCL 5 MG PO TABS: 5.0000 mg | ORAL_TABLET | ORAL | Status: DC | PRN

## 2018-07-02 MED ORDER — DIBUCAINE 1 % RE OINT: 1.0000 "application " | TOPICAL_OINTMENT | RECTAL | Status: DC | PRN

## 2018-07-02 MED ORDER — IBUPROFEN 600 MG PO TABS
600.0000 mg | ORAL_TABLET | Freq: Four times a day (QID) | ORAL | Status: DC
  Administered 2018-07-02 – 2018-07-03 (×3): 600 mg via ORAL
  Filled 2018-07-02 (×3): qty 1

## 2018-07-02 MED ORDER — SIMETHICONE 80 MG PO CHEW: 80.0000 mg | CHEWABLE_TABLET | ORAL | Status: DC | PRN

## 2018-07-02 MED ORDER — SODIUM CHLORIDE 0.9 % IV SOLN: 2.0000 g | Freq: Two times a day (BID) | INTRAVENOUS | Status: DC

## 2018-07-02 MED ORDER — MISOPROSTOL 200 MCG PO TABS
800.0000 ug | ORAL_TABLET | Freq: Once | ORAL | Status: AC
  Administered 2018-07-02: 800 ug via RECTAL

## 2018-07-02 MED ORDER — LACTATED RINGERS IV SOLN
INTRAVENOUS | Status: DC
  Administered 2018-07-02 – 2018-07-03 (×3): via INTRAVENOUS

## 2018-07-02 MED ORDER — BUTORPHANOL TARTRATE 1 MG/ML IJ SOLN
2.0000 mg | INTRAMUSCULAR | Status: DC | PRN
  Administered 2018-07-02: 2 mg via INTRAVENOUS

## 2018-07-02 MED ORDER — ONDANSETRON HCL 4 MG/2ML IJ SOLN: 4.0000 mg | INTRAMUSCULAR | Status: DC | PRN

## 2018-07-02 MED ORDER — BENZOCAINE-MENTHOL 20-0.5 % EX AERO: 1.0000 "application " | INHALATION_SPRAY | CUTANEOUS | Status: DC | PRN

## 2018-07-02 MED ORDER — PANTOPRAZOLE SODIUM 40 MG PO TBEC
40.0000 mg | DELAYED_RELEASE_TABLET | Freq: Every day | ORAL | Status: DC
  Administered 2018-07-02: 40 mg via ORAL
  Filled 2018-07-02: qty 1

## 2018-07-02 MED ORDER — SODIUM CHLORIDE 0.9 % IV SOLN
2.0000 g | Freq: Two times a day (BID) | INTRAVENOUS | Status: AC
  Administered 2018-07-02 – 2018-07-03 (×2): 2 g via INTRAVENOUS
  Filled 2018-07-02 (×2): qty 2

## 2018-07-02 MED ORDER — PRENATAL MULTIVITAMIN CH
1.0000 | ORAL_TABLET | Freq: Every day | ORAL | Status: DC
  Administered 2018-07-02: 1 via ORAL
  Filled 2018-07-02: qty 1

## 2018-07-02 MED ORDER — ZOLPIDEM TARTRATE 5 MG PO TABS: 5.0000 mg | ORAL_TABLET | Freq: Every evening | ORAL | Status: DC | PRN

## 2018-07-02 NOTE — Progress Notes (Signed)
Patient ID: Olivia Griffith, female   DOB: 10-28-1990, 28 y.o.   MRN: [id number]   Pt s/p SVD, placenta still in situ  Some clot evacuated, attempted to deliver placenta - unsuccessful  D/w pt possible need for suction D&C.  Will reevaluate at 10-11am  AFVSS

## 2018-07-02 NOTE — H&P (Signed)
Olivia Griffith is a 28 y.o. female, G3 P79, EGA [redacted]W[redacted]D with EDC 8-11 presenting for abdominal pain.  She was seen in MAU this weekend, diagnosed with PPROM at 19+ weeks, sent home with expectant management.  She presents tonight with increasing pain and vaginal pressure.  Exam by CNM in MAU reported greenish vaginal discharge, umbilical cord protruding through os without pulsation.  Bedside u/s by same provider reported as no cardiac activity c/w fetal demise.  Pt admitted for labor induction.  OB History    Gravida  4   Para  2   Term  2   Preterm  0   AB  1   Living  2     SAB  0   TAB  1   Ectopic  0   Multiple  0   Live Births  2          Past Medical History:  Diagnosis Date  . Medical history non-contributory   . Obese    Past Surgical History:  Procedure Laterality Date  . NO PAST SURGERIES     Family History: family history is not on file. Social History:  reports that she has never smoked. She has never used smokeless tobacco. She reports that she does not drink alcohol or use drugs.    Review of Systems  Respiratory: Negative.   Cardiovascular: Negative.    Maternal Medical History:  Reason for admission: Rupture of membranes.     Dilation: 3 Effacement (%): 60 Station: -1 Exam by:: s seagraves rn  Blood pressure (!) 117/54, pulse 84, temperature 98.4 F (36.9 C), resp. rate 16, height 5\' 7"  (1.702 m), weight (!) 142 kg, last menstrual period 01/08/2018, SpO2 99 %, unknown if currently breastfeeding. Exam Physical Exam  Vitals reviewed. Constitutional: She appears well-developed and well-nourished.  Cardiovascular: Normal rate and regular rhythm.  Respiratory: Effort normal. No respiratory distress.  GI: Soft.    Prenatal labs: ABO, Rh: --/--/A NEG, A NEG (03/25 1948) Antibody: POS (03/25 1948) Rubella: Immune (01/09 0000) RPR: Nonreactive (01/09 0000)  HBsAg: Negative (01/09 0000)  HIV: Non-reactive (01/09 0000)  GBS:      Assessment/Plan: IUP at [redacted]W[redacted]D with PPROM, now with fetal demise per CNM and prolapsing cord.  Pt admitted and put on cytotec every 8 hours.  She received one dose of cytotec, now comfortable with epidural.     Leighton Roach Kristofer Schaffert 07/02/2018, 6:32 AM

## 2018-07-02 NOTE — Progress Notes (Addendum)
Patient ID: Olivia Griffith, female   DOB: 1991-02-07, 28 y.o.   MRN: [id number]   Placenta manually extracted, extracted in pieces, but seems appropriate for gestational age. Uterus explored w banjo currette, seems clear  D/W pt poss need for D&C later if RPOC D/W pt manual extraction and need for antibiotic  Will get 2 doses of IV Cefotetan 2g - d/w pt Can be d/c after 2nd dose  Also give Cytotec and IV pitocin

## 2018-07-02 NOTE — Anesthesia Postprocedure Evaluation (Signed)
Anesthesia Post Note  Patient: Olivia Griffith  Procedure(s) Performed: AN AD HOC LABOR EPIDURAL     Patient location during evaluation: Mother Baby Anesthesia Type: Epidural Level of consciousness: awake Pain management: satisfactory to patient Vital Signs Assessment: post-procedure vital signs reviewed and stable Respiratory status: spontaneous breathing Cardiovascular status: stable Anesthetic complications: no    Last Vitals:  Vitals:   07/02/18 1509 07/02/18 1510  BP:  113/65  Pulse:  100  Resp: 18   Temp: 37 C   SpO2: 100% 100%    Last Pain:  Vitals:   07/02/18 1509  TempSrc: Oral  PainSc:    Pain Goal:                   [employer]

## 2018-07-03 LAB — CBC
HCT: 29.5 % — ABNORMAL LOW (ref 36.0–46.0)
Hemoglobin: 9.5 g/dL — ABNORMAL LOW (ref 12.0–15.0)
MCH: 29.3 pg (ref 26.0–34.0)
MCHC: 32.2 g/dL (ref 30.0–36.0)
MCV: 91 fL (ref 80.0–100.0)
PLATELETS: 160 10*3/uL (ref 150–400)
RBC: 3.24 MIL/uL — ABNORMAL LOW (ref 3.87–5.11)
RDW: 14.4 % (ref 11.5–15.5)
WBC: 9.8 10*3/uL (ref 4.0–10.5)
nRBC: 0 % (ref 0.0–0.2)

## 2018-07-03 MED ORDER — RHO D IMMUNE GLOBULIN 1500 UNIT/2ML IJ SOSY
300.0000 ug | PREFILLED_SYRINGE | Freq: Once | INTRAMUSCULAR | Status: AC
  Administered 2018-07-03: 300 ug via INTRAVENOUS
  Filled 2018-07-03: qty 2

## 2018-07-03 MED ORDER — IBUPROFEN 600 MG PO TABS: 600.0000 mg | ORAL_TABLET | Freq: Four times a day (QID) | ORAL | 1 refills | Status: DC | PRN

## 2018-07-03 MED ORDER — OXYCODONE HCL 5 MG PO TABS: 5.0000 mg | ORAL_TABLET | ORAL | 0 refills | Status: AC | PRN

## 2018-07-03 NOTE — Progress Notes (Signed)
Pt discharged with printed instructions. Pt verbalizes an understanding. No concerns noted. Buffi Ewton L Durrell Barajas, RN 

## 2018-07-03 NOTE — Lactation Note (Signed)
Lactation Consultation Note  Patient Name: Olivia Griffith VJKQA'S Date: 07/03/2018  [name] delivered at [redacted] weeks gestation,fetal demise.  This is her third baby, 4th pregnancy.  Did not inquire about bf with her others.  Reviewed ways to be more comfortable if her milk comes in with demise.  Urged mom to go ahead and start some ice packs that it may help prevent engorgement.  Mom reports she does have a DEBP  At home if she has to do some pumping for engorgement. Urged mom to only pump until she feels comfortable then stop. Mom reports she was on [facility] during pregnancy.  Gave [facility] handout and written info on how to be more comfortable after loss.Urged mom to call lactation as needed   Maternal Data    Feeding    LATCH Score                   Interventions    Lactation Tools Discussed/Used     Consult Status      Neomia Dear 07/03/2018, 10:58 AM

## 2018-07-03 NOTE — Progress Notes (Signed)
Patient ID: Olivia Griffith, female   DOB: Apr 02, 1991, 28 y.o.   MRN: [id number] PPD#1 Pt sitting up in bed eating. She reports mild cramping and lochia. No fever, chills, SOB or CP. Ambulating and voiding well. Ready for discharge to home today. [name] present VSS GEN - NAD, understandably teary  ABD - FF EXT - no homans  9.8>9.5<160  A/P: PPD1 s/p svd with demise         Discharge instructions reviewed - f/u in 4 weeks         Call with any concerns/coping difficulties [phone]

## 2018-07-03 NOTE — Discharge Summary (Signed)
OB Discharge Summary     Patient Name: Olivia Griffith DOB: 08/27/1990 MRN: [id number]  Date of admission: 07/01/2018 Delivering MD: Lavina Hamman   Date of discharge: 07/03/2018  Admitting diagnosis: EMS PAIN Intrauterine pregnancy: [redacted]w[redacted]d     Secondary diagnosis:  Active Problems:   Preterm premature rupture of membranes (PPROM) with onset of labor after 24 hours of rupture in second trimester, antepartum  Additional problems:neonatal demise     Discharge diagnosis: Preterm Pregnancy Delivered                                                                                                Post partum procedures:curettage  Augmentation: Cytotec  Complications: None  Hospital course:  Induction of Labor With Vaginal Delivery   28 y.o. yo [phone] at [redacted]w[redacted]d was admitted to the hospital 07/01/2018 for induction of labor.  Indication for induction: PPROM, cord prolapse.  Patient had an uncomplicated labor course as follows: Membrane Rupture Time/Date: 9:00 PM ,06/27/2018   Intrapartum Procedures: Episiotomy: None [1]                                         Lacerations:  None [1]  Patient had delivery of a Non Viable infant.  Information for the patient's newborn:  Adashia, Macaraeg [[id number]]  Delivery Method: Vaginal, Spontaneous(Filed from Delivery Summary)   07/02/2018  Details of delivery can be found in separate delivery note.  Patient had a routine postpartum course. Patient is discharged home 07/03/18.  Physical exam  Vitals:   07/02/18 1951 07/03/18 0043 07/03/18 0406 07/03/18 1001  BP: 110/61 (!) 99/43 (!) 100/49 (!) 102/51  Pulse: 95 83 80 93  Resp: 18 18 18 16   Temp: 98 F (36.7 C) 98 F (36.7 C) 97.9 F (36.6 C) 98.8 F (37.1 C)  TempSrc:    Oral  SpO2: 98% 100% 100% 100%  Weight:      Height:       General: alert, cooperative and no distress Lochia: appropriate Uterine Fundus: firm Incision: N/A DVT Evaluation: No evidence of DVT seen on physical  exam. Labs: Lab Results  Component Value Date   WBC 9.8 07/03/2018   HGB 9.5 (L) 07/03/2018   HCT 29.5 (L) 07/03/2018   MCV 91.0 07/03/2018   PLT 160 07/03/2018   CMP Latest Ref Rng & Units 08/29/2016  Glucose 65 - 99 mg/dL 793(J)  BUN 6 - 20 mg/dL 5(L)  Creatinine 0.30 - 1.00 mg/dL 0.92  Sodium 330 - 076 mmol/L 137  Potassium 3.5 - 5.1 mmol/L 3.9  Chloride 101 - 111 mmol/L 106  CO2 22 - 32 mmol/L 22  Calcium 8.9 - 10.3 mg/dL 9.7  Total Protein 6.5 - 8.1 g/dL 7.3  Total Bilirubin 0.3 - 1.2 mg/dL 2.2(Q)  Alkaline Phos 38 - 126 U/L 99  AST 15 - 41 U/L 196(H)  ALT 14 - 54 U/L 75(H)    Discharge instruction: per After Visit Summary and "Baby and Me Booklet".  After visit  meds:  Allergies as of 07/03/2018      Reactions   Demerol [meperidine] Anaphylaxis   Morphine And Related Rash   Itching and rash at administration site      Medication List    STOP taking these medications   Prenatal Vitamins 0.8 MG tablet     TAKE these medications   ibuprofen 600 MG tablet Commonly known as:  ADVIL,MOTRIN Take 1 tablet (600 mg total) by mouth every 6 (six) hours as needed for moderate pain or cramping.   omeprazole 20 MG capsule Commonly known as:  PRILOSEC Take 1 capsule (20 mg total) by mouth daily.   oxyCODONE 5 MG immediate release tablet Commonly known as:  Oxy IR/ROXICODONE Take 1 tablet (5 mg total) by mouth every 4 (four) hours as needed for up to 3 days for severe pain (pain scale 4-7).   valACYclovir 1000 MG tablet Commonly known as:  VALTREX Take 1 tablet (1,000 mg total) by mouth 2 (two) times daily.       Diet: routine diet  Activity: Advance as tolerated. Pelvic rest for 6 weeks.   Outpatient follow up:4 weeks Follow up Appt:No future appointments. Follow up Visit:No follow-ups on file.  Postpartum contraception: Not Discussed  Newborn Data: Live born female - demise shortly afterwards Birth Weight: 11.5 oz (325 g) APGAR: 1, 1  Newborn Delivery    Birth date/time:  07/02/2018 06:21:00 Delivery type:  Vaginal, Spontaneous     Disposition:morgue   07/03/2018 Cathrine Muster, DO

## 2018-07-03 NOTE — Progress Notes (Signed)
I offered grief support over several visits with family.  They requested prayer for their baby, Boris Lown, and I spent time with [place] and [employer].  They shared about their older children who are 12, 9, and 2.  They were given [facility] information for grief counseling as well as our information for follow up support.  Chaplain Dyanne Carrel, Bcc Pager, [phone] 3:46 PM    07/03/18 1500  Clinical Encounter Type  Visited With Patient and family together  Visit Type Spiritual support  Spiritual Encounters  Spiritual Needs Prayer;Grief support  Stress Factors  Patient Stress Factors Loss

## 2018-07-03 NOTE — Discharge Instructions (Signed)
Call with any concerns [phone]

## 2018-07-04 LAB — RH IG WORKUP (INCLUDES ABO/RH)
ABO/RH(D): A NEG
Gestational Age(Wks): 20
Unit division: 0

## 2018-07-05 LAB — BPAM RBC
Blood Product Expiration Date: 202004142359
Blood Product Expiration Date: 202004152359
Unit Type and Rh: 600
Unit Type and Rh: 600

## 2018-07-05 LAB — TYPE AND SCREEN
ABO/RH(D): A NEG
Antibody Screen: POSITIVE
UNIT DIVISION: 0
Unit division: 0

## 2018-09-14 ENCOUNTER — Other Ambulatory Visit: Payer: Self-pay | Admitting: Advanced Practice Midwife

## 2018-10-07 LAB — OB RESULTS CONSOLE ABO/RH

## 2018-10-07 LAB — OB RESULTS CONSOLE GC/CHLAMYDIA
Chlamydia: NEGATIVE
Gonorrhea: NEGATIVE

## 2018-10-07 LAB — OB RESULTS CONSOLE RUBELLA ANTIBODY, IGM: Rubella: IMMUNE

## 2018-10-07 LAB — OB RESULTS CONSOLE HEPATITIS B SURFACE ANTIGEN: Hepatitis B Surface Ag: NEGATIVE

## 2018-10-07 LAB — OB RESULTS CONSOLE HIV ANTIBODY (ROUTINE TESTING): HIV: NONREACTIVE

## 2018-10-07 LAB — OB RESULTS CONSOLE RPR: RPR: NONREACTIVE

## 2018-11-24 ENCOUNTER — Other Ambulatory Visit: Payer: Self-pay | Admitting: Student

## 2018-11-24 DIAGNOSIS — B009 Herpesviral infection, unspecified: Secondary | ICD-10-CM

## 2018-12-02 ENCOUNTER — Other Ambulatory Visit: Payer: Self-pay

## 2018-12-02 ENCOUNTER — Encounter (HOSPITAL_COMMUNITY): Payer: Self-pay | Admitting: *Deleted

## 2018-12-02 ENCOUNTER — Inpatient Hospital Stay (HOSPITAL_COMMUNITY)
Admission: AD | Admit: 2018-12-02 | Discharge: 2018-12-02 | Disposition: A | Discharge status: Home / Self Care | Payer: Medicaid Other | Source: Ambulatory Visit | Attending: Obstetrics and Gynecology | Admitting: Obstetrics and Gynecology

## 2018-12-02 DIAGNOSIS — O9989 Other specified diseases and conditions complicating pregnancy, childbirth and the puerperium: Secondary | ICD-10-CM | POA: Diagnosis not present

## 2018-12-02 DIAGNOSIS — O9A312 Physical abuse complicating pregnancy, second trimester: Secondary | ICD-10-CM | POA: Diagnosis not present

## 2018-12-02 DIAGNOSIS — O42912 Preterm premature rupture of membranes, unspecified as to length of time between rupture and onset of labor, second trimester: Secondary | ICD-10-CM | POA: Insufficient documentation

## 2018-12-02 DIAGNOSIS — O26892 Other specified pregnancy related conditions, second trimester: Secondary | ICD-10-CM

## 2018-12-02 DIAGNOSIS — M549 Dorsalgia, unspecified: Secondary | ICD-10-CM | POA: Insufficient documentation

## 2018-12-02 DIAGNOSIS — E669 Obesity, unspecified: Secondary | ICD-10-CM | POA: Insufficient documentation

## 2018-12-02 DIAGNOSIS — Z885 Allergy status to narcotic agent status: Secondary | ICD-10-CM | POA: Insufficient documentation

## 2018-12-02 DIAGNOSIS — Z3A17 17 weeks gestation of pregnancy: Secondary | ICD-10-CM | POA: Diagnosis not present

## 2018-12-02 DIAGNOSIS — O99212 Obesity complicating pregnancy, second trimester: Secondary | ICD-10-CM | POA: Diagnosis not present

## 2018-12-02 DIAGNOSIS — N898 Other specified noninflammatory disorders of vagina: Secondary | ICD-10-CM | POA: Diagnosis not present

## 2018-12-02 DIAGNOSIS — T7491XA Unspecified adult maltreatment, confirmed, initial encounter: Secondary | ICD-10-CM | POA: Diagnosis present

## 2018-12-02 LAB — AMNISURE RUPTURE OF MEMBRANE (ROM) NOT AT ARMC: Amnisure ROM: NEGATIVE
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
## 2018-12-02 NOTE — Discharge Instructions (Signed)
Intimate Partner Violence Information Intimate partner violence, also called domestic abuse or relationship abuse, is a pattern of behaviors used by one partner to gain or maintain power and control over the other partner. Intimate partner violence can happen to women and men and can happen between people who are or were:  Married.  Dating.  Living together. What are the types of intimate partner violence? Intimate partner violence can involve physical, emotional, psychological, sexual, and economic abuse, or stalking by a current or former partner. Different types of abuse can occur at the same time within the same relationship.  Physical abuse. This includes rough handling, threats with a weapon, throwing objects, pushing, or hitting.  Emotional and psychological abuse. This includes verbal attacks, rejection, humiliation, intimidation, social isolation, or threats. Abuse may also include limiting contact with family and friends.  Sexual assault. Sexual assault is any unwanted sexual activity that occurs without clear permission (consent) from both people. This includes unwanted touching and sexual harassment.  Economic abuse. This includes controlling money, food, transportation, or other belongings.  Stalking. This involves such things as repeated, unwanted phone calls, e-mails, or text messages, or watching the victim from a distance. What are some warning signs of intimate partner violence? Physical signs  Bruises.  Broken bones.  Burns or cuts.  Physical pain.  Head injury. Emotional and psychological signs  Crying.  Depression.  Hopelessness.  Desperation.  Trouble sleeping.  Fear of the partner.  Anxiety.  Suicidal thoughts or behavior.  Antisocial behavior.  Low self-esteem.  Fear of intimacy.  Flashbacks. Sexual signs  Bruising, swelling, or bleeding of the genital or rectal area.  Signs of an STI, such as genital sores, warts, or discharge  coming from the genital area.  Pain in the genital area.  Unintended pregnancy.  Problems with pregnancy. What are common behaviors of those affected by intimate partner violence? Those affected by intimate partner violence may:  Be late to work or other events.  Not show up to places as promised.  Have to let their partner know where they are and who they are with.  Be isolated or kept from seeing friends or family.  Make comments about their partner's temper or behavior.  Make excuses for their partner.  Engage in high-risk sexual behaviors.  Use drugs or alcohol.  Have unhealthy eating behaviors. What are common feelings of those affected by intimate partner violence? Victims of intimate partner violence may feel that they:  Must be careful not to say or do things that trigger their partner's anger.  Cannot do anything right.  Deserve to be treated badly.  Overreact to their partner's behavior or temper.  Cannot trust their own feelings.  Cannot trust other people.  Are trapped.  May have their children taken away by their partner.  Are emotionally drained or numb.  Are in danger.  Might have to kill their partner to survive. Where can you get help? If you do not feel safe searching for help online at home, use a computer at a [employer] to access the Internet. Call 911 if you are in immediate danger or need medical help. Intimate partner violence hotlines and websites  The [employer] Violence Hotline. ? 24-hour phone hotline: 512-128-4359 (SAFE) or (917) 614-2357 (TTY). ? Videophone: available Monday through Friday, 9 a.m. to 5 p.m. Call 616 650 3956. ? thehotline.org  The National Sexual Assault Hotline. ? 24-hour phone hotline: (743)850-1161. ? safehelpline.org Shelters for victims of intimate partner violence If you are a victim  of intimate partner violence, there are resources to help you find a temporary place for you and your  children to live (shelter). The specific address of these shelters is often not known to the public. Police Report assaults, threats, and stalking to the police. Counselors and counseling centers  Counseling can help you cope with difficult emotions and empower you to plan for your future safety. The topics you discuss with a counselor are private and confidential. Children of intimate partner violence victims also might need counseling to manage stress and anxiety. The court system You can work with a Clinical research associate or an advocate to get legal protection against an abuser. Protection includes restraining orders and private addresses. Crimes against you, such as assault, can also be prosecuted through the courts. Laws vary by state. Follow these instructions at home:  Create a safety plan that includes ways to remain safe while you are in an abusive relationship, while you are planning to leave, or after you leave. This plan may be created by the victim alone or with assistance from the domestic violence hotline staff or local shelter staff. Your safety plan may include: ? How to cope with emotions. ? How to tell friends and family about the abuse. ? How to take legal action. ? How to create a safe home environment. ? How to keep your children safe. ? Emergency plans for life-threatening situations. Get help right away if you:  Feel like you are in immediate danger.  Feel like you may hurt yourself or others. If you ever feel like you may hurt yourself or others, or have thoughts about taking your own life, get help right away. You can go to your nearest emergency department or call:  Your local emergency services (911 in the [place]  A suicide crisis helpline, such as the National Suicide Prevention Lifeline at 931-802-2190. This is open 24 hours a day. Summary  If you are a victim of intimate partner violence, there are resources to help you find a temporary place for you and your children to  live (shelter).  Create a safety plan that includes ways to remain safe while you are in an abusive relationship, while you are planning to leave, or after you leave. This information is not intended to replace advice given to you by your health care provider. Make sure you discuss any questions you have with your health care provider. Document Released: 06/15/2003 Document Revised: 05/09/2017 Document Reviewed: 05/09/2017 Elsevier Patient Education  2020 [employer].  Second Trimester of Pregnancy The second trimester is from week 14 through week 27 (months 4 through 6). The second trimester is often a time when you feel your best. Your body has adjusted to being pregnant, and you begin to feel better physically. Usually, morning sickness has lessened or quit completely, you may have more energy, and you may have an increase in appetite. The second trimester is also a time when the fetus is growing rapidly. At the end of the sixth month, the fetus is about 9 inches long and weighs about 1 pounds. You will likely begin to feel the baby move (quickening) between 16 and 20 weeks of pregnancy. Body changes during your second trimester Your body continues to go through many changes during your second trimester. The changes vary from woman to woman.  Your weight will continue to increase. You will notice your lower abdomen bulging out.  You may begin to get stretch marks on your hips, abdomen, and breasts.  You may  develop headaches that can be relieved by medicines. The medicines should be approved by your health care provider.  You may urinate more often because the fetus is pressing on your bladder.  You may develop or continue to have heartburn as a result of your pregnancy.  You may develop constipation because certain hormones are causing the muscles that push waste through your intestines to slow down.  You may develop hemorrhoids or swollen, bulging veins (varicose veins).  You may  have back pain. This is caused by: ? Weight gain. ? Pregnancy hormones that are relaxing the joints in your pelvis. ? A shift in weight and the muscles that support your balance.  Your breasts will continue to grow and they will continue to become tender.  Your gums may bleed and may be sensitive to brushing and flossing.  Dark spots or blotches (chloasma, mask of pregnancy) may develop on your face. This will likely fade after the baby is born.  A dark line from your belly button to the pubic area (linea nigra) may appear. This will likely fade after the baby is born.  You may have changes in your hair. These can include thickening of your hair, rapid growth, and changes in texture. Some women also have hair loss during or after pregnancy, or hair that feels dry or thin. Your hair will most likely return to normal after your baby is born. What to expect at prenatal visits During a routine prenatal visit:  You will be weighed to make sure you and the fetus are growing normally.  Your blood pressure will be taken.  Your abdomen will be measured to track your baby's growth.  The fetal heartbeat will be listened to.  Any test results from the previous visit will be discussed. Your health care provider may ask you:  How you are feeling.  If you are feeling the baby move.  If you have had any abnormal symptoms, such as leaking fluid, bleeding, severe headaches, or abdominal cramping.  If you are using any tobacco products, including cigarettes, chewing tobacco, and electronic cigarettes.  If you have any questions. Other tests that may be performed during your second trimester include:  Blood tests that check for: ? Low iron levels (anemia). ? High blood sugar that affects pregnant women (gestational diabetes) between 12 and 28 weeks. ? Rh antibodies. This is to check for a protein on red blood cells (Rh factor).  Urine tests to check for infections, diabetes, or protein in the  urine.  An ultrasound to confirm the proper growth and development of the baby.  An amniocentesis to check for possible genetic problems.  Fetal screens for spina bifida and Down syndrome.  HIV (human immunodeficiency virus) testing. Routine prenatal testing includes screening for HIV, unless you choose not to have this test. Follow these instructions at home: Medicines  Follow your health care provider's instructions regarding medicine use. Specific medicines may be either safe or unsafe to take during pregnancy.  Take a prenatal vitamin that contains at least 600 micrograms (mcg) of folic acid.  If you develop constipation, try taking a stool softener if your health care provider approves. Eating and drinking   Eat a balanced diet that includes fresh fruits and vegetables, whole grains, good sources of protein such as meat, eggs, or tofu, and low-fat dairy. Your health care provider will help you determine the amount of weight gain that is right for you.  Avoid raw meat and uncooked cheese. These carry germs  that can cause birth defects in the baby.  If you have low calcium intake from food, talk to your health care provider about whether you should take a daily calcium supplement.  Limit foods that are high in fat and processed sugars, such as fried and sweet foods.  To prevent constipation: ? Drink enough fluid to keep your urine clear or pale yellow. ? Eat foods that are high in fiber, such as fresh fruits and vegetables, whole grains, and beans. Activity  Exercise only as directed by your health care provider. Most women can continue their usual exercise routine during pregnancy. Try to exercise for 30 minutes at least 5 days a week. Stop exercising if you experience uterine contractions.  Avoid heavy lifting, wear low heel shoes, and practice good posture.  A sexual relationship may be continued unless your health care provider directs you otherwise. Relieving pain and  discomfort  Wear a good support bra to prevent discomfort from breast tenderness.  Take warm sitz baths to soothe any pain or discomfort caused by hemorrhoids. Use hemorrhoid cream if your health care provider approves.  Rest with your legs elevated if you have leg cramps or low back pain.  If you develop varicose veins, wear support hose. Elevate your feet for 15 minutes, 3-4 times a day. Limit salt in your diet. Prenatal Care  Write down your questions. Take them to your prenatal visits.  Keep all your prenatal visits as told by your health care provider. This is important. Safety  Wear your seat belt at all times when driving.  Make a list of emergency phone numbers, including numbers for family, friends, the hospital, and police and fire departments. General instructions  Ask your health care provider for a referral to a local prenatal education class. Begin classes no later than the beginning of month 6 of your pregnancy.  Ask for help if you have counseling or nutritional needs during pregnancy. Your health care provider can offer advice or refer you to specialists for help with various needs.  Do not use hot tubs, steam rooms, or saunas.  Do not douche or use tampons or scented sanitary pads.  Do not cross your legs for long periods of time.  Avoid cat litter boxes and soil used by cats. These carry germs that can cause birth defects in the baby and possibly loss of the fetus by miscarriage or stillbirth.  Avoid all smoking, herbs, alcohol, and unprescribed drugs. Chemicals in these products can affect the formation and growth of the baby.  Do not use any products that contain nicotine or tobacco, such as cigarettes and e-cigarettes. If you need help quitting, ask your health care provider.  Visit your dentist if you have not gone yet during your pregnancy. Use a soft toothbrush to brush your teeth and be gentle when you floss. Contact a health care provider if:  You  have dizziness.  You have mild pelvic cramps, pelvic pressure, or nagging pain in the abdominal area.  You have persistent nausea, vomiting, or diarrhea.  You have a bad smelling vaginal discharge.  You have pain when you urinate. Get help right away if:  You have a fever.  You are leaking fluid from your vagina.  You have spotting or bleeding from your vagina.  You have severe abdominal cramping or pain.  You have rapid weight gain or weight loss.  You have shortness of breath with chest pain.  You notice sudden or extreme swelling of your face, hands,  ankles, feet, or legs.  You have not felt your baby move in over an hour.  You have severe headaches that do not go away when you take medicine.  You have vision changes. Summary  The second trimester is from week 14 through week 27 (months 4 through 6). It is also a time when the fetus is growing rapidly.  Your body goes through many changes during pregnancy. The changes vary from woman to woman.  Avoid all smoking, herbs, alcohol, and unprescribed drugs. These chemicals affect the formation and growth your baby.  Do not use any tobacco products, such as cigarettes, chewing tobacco, and e-cigarettes. If you need help quitting, ask your health care provider.  Contact your health care provider if you have any questions. Keep all prenatal visits as told by your health care provider. This is important. This information is not intended to replace advice given to you by your health care provider. Make sure you discuss any questions you have with your health care provider. Document Released: 03/19/2001 Document Revised: 07/17/2018 Document Reviewed: 04/30/2016 Elsevier Patient Education  2020 [employer]

## 2019-03-13 ENCOUNTER — Inpatient Hospital Stay (HOSPITAL_BASED_OUTPATIENT_CLINIC_OR_DEPARTMENT_OTHER): Payer: Medicaid Other

## 2019-03-13 ENCOUNTER — Inpatient Hospital Stay (HOSPITAL_COMMUNITY)
Admission: AD | Admit: 2019-03-13 | Discharge: 2019-03-13 | Disposition: A | Discharge status: Home / Self Care | Payer: Medicaid Other | Attending: Obstetrics and Gynecology | Admitting: Obstetrics and Gynecology

## 2019-03-13 ENCOUNTER — Encounter (HOSPITAL_COMMUNITY): Payer: Self-pay

## 2019-03-13 ENCOUNTER — Other Ambulatory Visit: Payer: Self-pay

## 2019-03-13 DIAGNOSIS — O09893 Supervision of other high risk pregnancies, third trimester: Secondary | ICD-10-CM

## 2019-03-13 DIAGNOSIS — O4703 False labor before 37 completed weeks of gestation, third trimester: Secondary | ICD-10-CM | POA: Diagnosis not present

## 2019-03-13 DIAGNOSIS — Z3689 Encounter for other specified antenatal screening: Secondary | ICD-10-CM | POA: Insufficient documentation

## 2019-03-13 DIAGNOSIS — R109 Unspecified abdominal pain: Secondary | ICD-10-CM | POA: Diagnosis present

## 2019-03-13 DIAGNOSIS — O289 Unspecified abnormal findings on antenatal screening of mother: Secondary | ICD-10-CM

## 2019-03-13 DIAGNOSIS — Z3A31 31 weeks gestation of pregnancy: Secondary | ICD-10-CM | POA: Insufficient documentation

## 2019-03-13 DIAGNOSIS — R8789 Other abnormal findings in specimens from female genital organs: Secondary | ICD-10-CM

## 2019-03-13 DIAGNOSIS — Z3A32 32 weeks gestation of pregnancy: Secondary | ICD-10-CM | POA: Diagnosis not present

## 2019-03-13 DIAGNOSIS — Z885 Allergy status to narcotic agent status: Secondary | ICD-10-CM | POA: Insufficient documentation

## 2019-03-13 LAB — URINALYSIS, ROUTINE W REFLEX MICROSCOPIC
Bilirubin Urine: NEGATIVE
Glucose, UA: NEGATIVE mg/dL
Hgb urine dipstick: NEGATIVE
Ketones, ur: NEGATIVE mg/dL
Leukocytes,Ua: NEGATIVE
Nitrite: NEGATIVE
Protein, ur: NEGATIVE mg/dL
Specific Gravity, Urine: 1.011 (ref 1.005–1.030)
pH: 7 (ref 5.0–8.0)

## 2019-03-13 MED ORDER — BETAMETHASONE SOD PHOS & ACET 6 (3-3) MG/ML IJ SUSP
12.0000 mg | Freq: Once | INTRAMUSCULAR | Status: AC
  Administered 2019-03-13: 12 mg via INTRAMUSCULAR
  Filled 2019-03-13: qty 5

## 2019-03-13 NOTE — MAU Provider Note (Signed)
Chief Complaint:  Abdominal Pain   First Provider Initiated Contact with Patient 03/13/19 1041      HPI: Olivia Griffith is a 28 y.o. [id number] at [redacted]w[redacted]d pt of [facility] with obstetric hx of 19 week PPROM and previable delivery who presents to maternity admissions reporting irregular cramping, 4/10 pain, unchanged in the last 2 weeks. She had visit in the office this week and FFN was positive and she had nonreactive NST with normal BPP. She was closed/thick/high on Dr Berenda Morale exam in the office. She is feeling normal fetal movement. There are no other symptoms.     HPI  Past Medical History: Past Medical History:  Diagnosis Date  . Medical history non-contributory   . Obese     Past obstetric history: OB History  Gravida Para Term Preterm AB Living  5 3 2  0 1 2  SAB TAB Ectopic Multiple Live Births  0 1 0 0 3    # Outcome Date GA Lbr Len/2nd Weight Sex Delivery Anes PTL Lv  5 Current           4 Para 07/02/18 [redacted]w[redacted]d  325 g M Vag-Spont EPI  ND     Birth Comments: severely preterm   3 Term 06/30/16 [redacted]w[redacted]d 11:01 / 00:40 3100 g F Vag-Spont EPI  LIV  2 Term 02/03/06    M Vag-Spont  N LIV  1 TAB             Past Surgical History: Past Surgical History:  Procedure Laterality Date  . NO PAST SURGERIES      Family History: History reviewed. No pertinent family history.  Social History: Social History   Tobacco Use  . Smoking status: Never Smoker  . Smokeless tobacco: Never Used  Substance Use Topics  . Alcohol use: No  . Drug use: No    Allergies:  Allergies  Allergen Reactions  . Demerol [Meperidine] Anaphylaxis  . Morphine And Related Rash    Itching and rash at administration site    Meds:  No medications prior to admission.    ROS:  Review of Systems  Constitutional: Negative for chills, fatigue and fever.  Eyes: Negative for visual disturbance.  Respiratory: Negative for shortness of breath.   Cardiovascular: Negative for chest pain.   Gastrointestinal: Positive for abdominal pain. Negative for nausea and vomiting.  Genitourinary: Negative for difficulty urinating, dysuria, flank pain, pelvic pain, vaginal bleeding, vaginal discharge and vaginal pain.  Neurological: Negative for dizziness and headaches.  Psychiatric/Behavioral: Negative.      I have reviewed patient's Past Medical Hx, Surgical Hx, Family Hx, Social Hx, medications and allergies.   Physical Exam   Patient Vitals for the past 24 hrs:  BP Temp Temp src Pulse Resp SpO2  03/13/19 1156 127/66 - - (!) 103 - 99 %  03/13/19 1153 127/66 - - - 20 99 %  03/13/19 1022 126/73 98.5 F (36.9 C) Oral (!) 106 20 -   Constitutional: Well-developed, well-nourished female in no acute distress.  Cardiovascular: normal rate Respiratory: normal effort GI: Abd soft, non-tender, gravid appropriate for gestational age.  MS: Extremities nontender, no edema, normal ROM Neurologic: Alert and oriented x 4.  GU: Neg CVAT.  PELVIC EXAM:   Dilation: Closed Effacement (%): Thick Exam by:: [email or web address] CNM   FHT:  Baseline 155 , moderate variability, accelerations present, no decelerations.  Initially, FHR with elevated baseline of 160s, minimal to moderate variability.  Then, FHR tracing difficult to obtain with external monitor.  BPP ordered.  Contractions: irregular mild to palpation   Labs: Results for orders placed or performed during the hospital encounter of 03/13/19 (from the past 24 hour(s))  Urinalysis, Routine w reflex microscopic     Status: Abnormal   Collection Time: 03/13/19 10:18 AM  Result Value Ref Range   Color, Urine YELLOW YELLOW   APPearance HAZY (A) CLEAR   Specific Gravity, Urine 1.011 1.005 - 1.030   pH 7.0 5.0 - 8.0   Glucose, UA NEGATIVE NEGATIVE mg/dL   Hgb urine dipstick NEGATIVE NEGATIVE   Bilirubin Urine NEGATIVE NEGATIVE   Ketones, ur NEGATIVE NEGATIVE mg/dL   Protein, ur NEGATIVE NEGATIVE mg/dL   Nitrite NEGATIVE NEGATIVE    Leukocytes,Ua NEGATIVE NEGATIVE   --/--/A NEG (03/26 1414)  Imaging:  No results found.  MAU Course/MDM: Orders Placed This Encounter  Procedures  . [place] MFM Fetal BPP Wo Non Stress  . Urinalysis, Routine w reflex microscopic  . Discharge patient    Meds ordered this encounter  Medications  . betamethasone acetate-betamethasone sodium phosphate (CELESTONE) injection 12 mg     NST reviewed.  Not reactive initially but with 10 x 10 accels prior to loss of tracing due to fetal position. FHR visualized in 150s by bedside US. Formal US done at bedside and BPP 8/8.   No evidence of preterm labor with closed cervix, infrequent contractions but given pt poor obstetric hx and positive FFN in office, Betamethasone given today and second dose ordered for tomorrow. BPP normal today.  Discussed results with pt, reassurance provided. Preterm labor precautions/reasons to return to MAU reviewed.   Keep appts as scheduled in the office.   Assessment: 1. Threatened premature labor in third trimester   2. Positive fetal fibronectin at 22 weeks to [redacted] weeks gestation   3. NST (non-stress test) reactive     Plan: Discharge home Labor precautions and fetal kick counts Follow-up Information    [facility] up.   Why: Return to MAU on Sunday, 03/14/19, at approximately 11 am for steroid injection.  Return sooner as needed for emergencies. Contact information: [place] [phone]       Paula Compton, MD Follow up.   Specialty: Obstetrics and Gynecology Why: As scheduled Contact information: [address] [place] [address] [phone]          Allergies as of 03/13/2019      Reactions   Demerol [meperidine] Anaphylaxis   Morphine And Related Rash   Itching and rash at administration site      Medication List    TAKE these medications   M-Natal Plus 27-1 MG Tabs TAKE 1 TABLET BY MOUTH DAILY    omeprazole 20 MG capsule Commonly known as: PRILOSEC Take 1 capsule (20 mg total) by mouth daily.   valACYclovir 1000 MG tablet Commonly known as: VALTREX TAKE 1 TABLET(1000 MG) BY MOUTH TWICE DAILY       Fatima Blank Certified Nurse-Midwife 03/13/2019 3:29 PM

## 2019-03-13 NOTE — MAU Note (Signed)
Pt reports to mau with c/o lower abd cramping for the past 3-4 days that she rates 4/10.  Pt denies LOF or vag bleeding.  Reports good fetal movement.

## 2019-03-14 ENCOUNTER — Other Ambulatory Visit: Payer: Self-pay

## 2019-03-14 ENCOUNTER — Inpatient Hospital Stay (HOSPITAL_COMMUNITY)
Admission: AD | Admit: 2019-03-14 | Discharge: 2019-03-14 | Disposition: A | Discharge status: Home / Self Care | Payer: Medicaid Other | Attending: Obstetrics and Gynecology | Admitting: Obstetrics and Gynecology

## 2019-03-14 DIAGNOSIS — O4703 False labor before 37 completed weeks of gestation, third trimester: Secondary | ICD-10-CM | POA: Insufficient documentation

## 2019-03-14 DIAGNOSIS — Z3A31 31 weeks gestation of pregnancy: Secondary | ICD-10-CM | POA: Insufficient documentation

## 2019-03-14 MED ORDER — BETAMETHASONE SOD PHOS & ACET 6 (3-3) MG/ML IJ SUSP
12.0000 mg | Freq: Once | INTRAMUSCULAR | Status: AC
  Administered 2019-03-14: 11:00:00 12 mg via INTRAMUSCULAR

## 2019-03-14 NOTE — MAU Note (Signed)
Olivia Griffith is a 28 y.o. at [redacted]w[redacted]d here in MAU reporting: here for 2nd BMZ injection. States she is still cramping but it is the same as previous visit, has not changed in intensity. No bleeding or LOF. +FM  Pain score: 3/10  Vitals:   03/14/19 1059  BP: 130/80  Pulse: (!) 102  Resp: 18  Temp: 98.1 F (36.7 C)  SpO2: 99%     FHT:154  Lab orders placed from triage: none  BMZ order entered

## 2019-04-07 LAB — OB RESULTS CONSOLE GBS: GBS: NEGATIVE

## 2019-04-09 NOTE — L&D Delivery Note (Signed)
Delivery Note Pt progressed rapidly to complete dilation and pushed about 10 minutes.  At 3:13 PM a viable female was delivered via Vaginal, Spontaneous (Presentation: Left Occiput Anterior). Suctioned for meconium stained fluid after delivery of  APGAR: 5, 8; weight 7 lb 9 oz (3430 g).  Baby was noted to have some grunting after delivery and O2 sats in high 80's to 90 so NICU team called to evaluate and started CPAP.   Placenta status: Spontaneous;Pathology.  Placenta delivered spontaneously with trailing membranes in uterus and fundal exploration removed them and one placental fragment noted.  Placenta sent to pathology to evaluate for possible infection.   Cord: 3 vessels with the following complications: None.   Anesthesia: Epidural Episiotomy: None Lacerations: None Suture Repair: n/a Est. Blood Loss (mL):  Mom to postpartum.  Baby to NICU.  [name] 05/03/2019, 4:00 PM

## 2019-04-15 ENCOUNTER — Other Ambulatory Visit: Payer: Self-pay

## 2019-04-15 ENCOUNTER — Inpatient Hospital Stay (HOSPITAL_COMMUNITY)
Admission: AD | Admit: 2019-04-15 | Discharge: 2019-04-15 | Disposition: A | Discharge status: Home / Self Care | Payer: Medicaid Other | Attending: Obstetrics and Gynecology | Admitting: Obstetrics and Gynecology

## 2019-04-15 ENCOUNTER — Encounter (HOSPITAL_COMMUNITY): Payer: Self-pay | Admitting: Obstetrics and Gynecology

## 2019-04-15 DIAGNOSIS — O26893 Other specified pregnancy related conditions, third trimester: Secondary | ICD-10-CM | POA: Insufficient documentation

## 2019-04-15 DIAGNOSIS — M549 Dorsalgia, unspecified: Secondary | ICD-10-CM | POA: Diagnosis not present

## 2019-04-15 DIAGNOSIS — O4703 False labor before 37 completed weeks of gestation, third trimester: Secondary | ICD-10-CM

## 2019-04-15 DIAGNOSIS — K529 Noninfective gastroenteritis and colitis, unspecified: Secondary | ICD-10-CM

## 2019-04-15 DIAGNOSIS — Z3A36 36 weeks gestation of pregnancy: Secondary | ICD-10-CM

## 2019-04-15 DIAGNOSIS — Z3689 Encounter for other specified antenatal screening: Secondary | ICD-10-CM

## 2019-04-15 LAB — URINALYSIS, ROUTINE W REFLEX MICROSCOPIC
Bilirubin Urine: NEGATIVE
Glucose, UA: NEGATIVE mg/dL
Hgb urine dipstick: NEGATIVE
Ketones, ur: NEGATIVE mg/dL
Leukocytes,Ua: NEGATIVE
Nitrite: NEGATIVE
Protein, ur: NEGATIVE mg/dL
Specific Gravity, Urine: 1.01 (ref 1.005–1.030)
pH: 6 (ref 5.0–8.0)

## 2019-04-15 MED ORDER — ACETAMINOPHEN 500 MG PO TABS
1000.0000 mg | ORAL_TABLET | Freq: Four times a day (QID) | ORAL | Status: DC | PRN
  Administered 2019-04-15: 1000 mg via ORAL
  Filled 2019-04-15: qty 2

## 2019-04-15 MED ORDER — BUTORPHANOL TARTRATE 1 MG/ML IJ SOLN
1.0000 mg | Freq: Once | INTRAMUSCULAR | Status: AC
  Administered 2019-04-15: 1 mg via INTRAMUSCULAR
  Filled 2019-04-15: qty 1

## 2019-04-15 MED ORDER — CYCLOBENZAPRINE HCL 10 MG PO TABS
10.0000 mg | ORAL_TABLET | Freq: Three times a day (TID) | ORAL | Status: DC | PRN
  Administered 2019-04-15: 10 mg via ORAL
  Filled 2019-04-15: qty 1

## 2019-04-15 MED ORDER — PROMETHAZINE HCL 25 MG/ML IJ SOLN
12.5000 mg | Freq: Four times a day (QID) | INTRAMUSCULAR | Status: DC | PRN
  Administered 2019-04-15: 14:00:00 12.5 mg via INTRAMUSCULAR
  Filled 2019-04-15: qty 1

## 2019-04-15 NOTE — MAU Note (Signed)
Olivia Griffith is a 29 y.o. at [redacted]w[redacted]d here in MAU reporting: pressure and back pain for the past 3 hours. States her belly gets tight but she feels the pain in her back. No vaginal bleeding and LOF. Felt some FM earlier but none since. Watery stool x 3 today. No N/V.  Onset of complaint: today  Pain score: 7/10  Vitals:   04/15/19 1126  BP: 116/76  Pulse: (!) 106  Resp: 20  Temp: 97.8 F (36.6 C)  SpO2: 100%     FHT: 144  Lab orders placed from triage: UA

## 2019-04-15 NOTE — Discharge Instructions (Signed)
Braxton Hicks Contractions °Contractions of the uterus can occur throughout pregnancy, but they are not always a sign that you are in labor. You may have practice contractions called Braxton Hicks contractions. These false labor contractions are sometimes confused with true labor. °What are Braxton Hicks contractions? °Braxton Hicks contractions are tightening movements that occur in the muscles of the uterus before labor. Unlike true labor contractions, these contractions do not result in opening (dilation) and thinning of the cervix. Toward the end of pregnancy (32-34 weeks), Braxton Hicks contractions can happen more often and may become stronger. These contractions are sometimes difficult to tell apart from true labor because they can be very uncomfortable. You should not feel embarrassed if you go to the hospital with false labor. °Sometimes, the only way to tell if you are in true labor is for your health care provider to look for changes in the cervix. The health care provider will do a physical exam and may monitor your contractions. If you are not in true labor, the exam should show that your cervix is not dilating and your water has not broken. °If there are no other health problems associated with your pregnancy, it is completely safe for you to be sent home with false labor. You may continue to have Braxton Hicks contractions until you go into true labor. °How to tell the difference between true labor and false labor °True labor °· Contractions last 30-70 seconds. °· Contractions become very regular. °· Discomfort is usually felt in the top of the uterus, and it spreads to the lower abdomen and low back. °· Contractions do not go away with walking. °· Contractions usually become more intense and increase in frequency. °· The cervix dilates and gets thinner. °False labor °· Contractions are usually shorter and not as strong as true labor contractions. °· Contractions are usually irregular. °· Contractions  are often felt in the front of the lower abdomen and in the groin. °· Contractions may go away when you walk around or change positions while lying down. °· Contractions get weaker and are shorter-lasting as time goes on. °· The cervix usually does not dilate or become thin. °Follow these instructions at home: ° °· Take over-the-counter and prescription medicines only as told by your health care provider. °· Keep up with your usual exercises and follow other instructions from your health care provider. °· Eat and drink lightly if you think you are going into labor. °· If Braxton Hicks contractions are making you uncomfortable: °? Change your position from lying down or resting to walking, or change from walking to resting. °? Sit and rest in a tub of warm water. °? Drink enough fluid to keep your urine pale yellow. Dehydration may cause these contractions. °? Do slow and deep breathing several times an hour. °· Keep all follow-up prenatal visits as told by your health care provider. This is important. °Contact a health care provider if: °· You have a fever. °· You have continuous pain in your abdomen. °Get help right away if: °· Your contractions become stronger, more regular, and closer together. °· You have fluid leaking or gushing from your vagina. °· You pass blood-tinged mucus (bloody show). °· You have bleeding from your vagina. °· You have low back pain that you never had before. °· You feel your baby’s head pushing down and causing pelvic pressure. °· Your baby is not moving inside you as much as it used to. °Summary °· Contractions that occur before labor are   called Braxton Hicks contractions, false labor, or practice contractions.  Braxton Hicks contractions are usually shorter, weaker, farther apart, and less regular than true labor contractions. True labor contractions usually become progressively stronger and regular, and they become more frequent.  Manage discomfort from [facility] contractions  by changing position, resting in a warm bath, drinking plenty of water, or practicing deep breathing. This information is not intended to replace advice given to you by your health care provider. Make sure you discuss any questions you have with your health care provider. Document Revised: 03/07/2017 Document Reviewed: 08/08/2016 Elsevier Patient Education  [place].   Viral Gastroenteritis, Adult  Viral gastroenteritis is also known as the stomach flu. This condition may affect your stomach, your small intestine, and your large intestine. It can cause sudden watery poop (diarrhea), fever, and throwing up (vomiting). This condition is caused by certain germs (viruses). These germs can be passed from person to person very easily (are contagious). Having watery poop and throwing up can make you feel weak and cause you to not have enough water in your body (get dehydrated). This can make you tired and thirsty, make you have a dry mouth, and make it so you pee (urinate) less often. It is important to replace the fluids that you lose from having watery poop and throwing up. What are the causes?  You can get sick by catching viruses from other people.  You can also get sick by: ? Eating food, drinking water, or touching a surface that has the viruses on it (is contaminated). ? Sharing utensils or other personal items with a person who is sick. What increases the risk?  Having a weak body defense system (immune system).  Living with one or more children who are younger than 22 years old.  Living in a nursing home.  Going on cruise ships. What are the signs or symptoms? Symptoms of this condition start suddenly. Symptoms may last for a few days or for as long as a week.  Common symptoms include: ? Watery poop. ? Throwing up.  Other symptoms include: ? Fever. ? Headache. ? Feeling tired (fatigue). ? Pain in the belly (abdomen). ? Chills. ? Feeling weak. ? Feeling sick to your  stomach (nauseous). ? Muscle aches. ? Not feeling hungry. How is this treated?  This condition typically goes away on its own.  The focus of treatment is to replace the fluids that you lose. This condition may be treated with: ? An ORS (oral rehydration solution). This is a drink that is sold at pharmacies and stores. ? Medicines to help with your symptoms. ? Probiotic supplements to reduce symptoms of diarrhea. ? Fluids given through an IV tube, if needed.  Older adults and people with other diseases or a weak body defense system are at higher risk for not having enough water in the body. Follow these instructions at home: Eating and drinking   Take an ORS as told by your doctor.  Drink clear fluids in small amounts as you are able. Clear fluids include: ? Water. ? Ice chips. ? Fruit juice with water added to it (diluted). ? Low-calorie sports drinks.  Drink enough fluid to keep your pee (urine) pale yellow.  Eat small amounts of healthy foods every 3-4 hours as you are able. This may include whole grains, fruits, vegetables, lean meats, and yogurt.  Avoid fluids that have a lot of sugar or caffeine in them, such as energy drinks, sports drinks, and soda.  Avoid spicy or fatty foods.  Avoid alcohol. General instructions   Wash your hands often. This is very important after you have watery poop or you throw up. If you cannot use soap and water, use hand sanitizer.  Make sure that all people in your home wash their hands well and often.  Take over-the-counter and prescription medicines only as told by your doctor.  Rest at home while you get better.  Watch your condition for any changes.  Take a warm bath to help with any burning or pain from having watery poop.  Keep all follow-up visits as told by your doctor. This is important. Contact a doctor if:  You cannot keep fluids down.  Your symptoms get worse.  You have new symptoms.  You feel  light-headed.  You feel dizzy.  You have muscle cramps. Get help right away if:  You have chest pain.  You feel very weak.  You pass out (faint).  You see blood in your throw-up.  Your throw-up looks like coffee grounds.  You have bloody or black poop (stools) or poop that looks like tar.  You have a very bad headache, or a stiff neck, or both.  You have a rash.  You have very bad pain, cramping, or bloating in your belly.  You have trouble breathing.  You are breathing very quickly.  You have a fast heartbeat.  Your skin feels cold and clammy.  You feel mixed up (confused).  You have pain when you pee.  You have signs of not having enough water in the body, such as: ? Dark pee, hardly any pee, or no pee. ? Cracked lips. ? Dry mouth. ? Sunken eyes. ? Feeling very sleepy. ? Feeling weak. Summary  Viral gastroenteritis is also known as the stomach flu.  This condition can cause sudden watery poop (diarrhea), fever, and throwing up (vomiting).  These germs can be passed from person to person very easily.  Take an ORS as told by your doctor. This is a drink that is sold at pharmacies and stores.  Drink fluids in small amounts many times each day as you are able. This information is not intended to replace advice given to you by your health care provider. Make sure you discuss any questions you have with your health care provider. Document Revised: 01/28/2018 Document Reviewed: 01/28/2018 Elsevier Patient Education  2020 [employer].                   Safe Medications in Pregnancy    Acne: Benzoyl Peroxide Salicylic Acid  Backache/Headache: Tylenol: 2 regular strength every 4 hours OR              2 Extra strength every 6 hours  Colds/Coughs/Allergies: Benadryl (alcohol free) 25 mg every 6 hours as needed Breath right strips Claritin Cepacol throat lozenges Chloraseptic throat spray Cold-Eeze- up to three times per day Cough drops, alcohol  free Flonase (by prescription only) Guaifenesin Mucinex Robitussin DM (plain only, alcohol free) Saline nasal spray/drops Sudafed (pseudoephedrine) & Actifed ** use only after [redacted] weeks gestation and if you do not have high blood pressure Tylenol Vicks Vaporub Zinc lozenges Zyrtec   Constipation: Colace Ducolax suppositories Fleet enema Glycerin suppositories Metamucil Milk of magnesia Miralax Senokot Smooth move tea  Diarrhea: Kaopectate Imodium A-D  *NO pepto Bismol  Hemorrhoids: Anusol Anusol HC Preparation H Tucks  Indigestion: Tums Maalox Mylanta Zantac  Pepcid  Insomnia: Benadryl (alcohol free) 25mg  every 6 hours as needed  Tylenol PM Unisom, no Gelcaps  Leg Cramps: Tums MagGel  Nausea/Vomiting:  Bonine Dramamine Emetrol Ginger extract Sea bands Meclizine  Nausea medication to take during pregnancy:  Unisom (doxylamine succinate 25 mg tablets) Take one tablet daily at bedtime. If symptoms are not adequately controlled, the dose can be increased to a maximum recommended dose of two tablets daily (1/2 tablet in the morning, 1/2 tablet mid-afternoon and one at bedtime). Vitamin B6 100mg tablets. Take one tablet twice a day (up to 200 mg per day).  Skin Rashes: Aveeno products Benadryl cream or 25mg every 6 hours as needed Calamine Lotion 1% cortisone cream  Yeast infection: Gyne-lotrimin 7 Monistat 7   **If taking multiple medications, please check labels to avoid duplicating the same active ingredients **take medication as directed on the label ** Do not exceed 4000 mg of tylenol in 24 hours **Do not take medications that contain aspirin or ibuprofen   

## 2019-04-15 NOTE — MAU Provider Note (Signed)
History     CSN: [id number]  Arrival date and time: 04/15/19 1110   First Provider Initiated Contact with Patient 04/15/19 1155     28 y.o. [id number] @36 .3 wks presenting for ctx. Reports ctx started around 8am. Frequency is q2 min. Feeling them in back, lasting a few seconds. Denies VB or LOF. Reports decreased FM today, has felt some ctx. Also reports watery diarrhea today. Denies sick contacts. No other GI sx.   OB History    Gravida  5   Para  3   Term  2   Preterm  0   AB  1   Living  2     SAB  0   TAB  1   Ectopic  0   Multiple  0   Live Births  3           Past Medical History:  Diagnosis Date  . Medical history non-contributory   . Obese     Past Surgical History:  Procedure Laterality Date  . NO PAST SURGERIES      History reviewed. No pertinent family history.  Social History   Tobacco Use  . Smoking status: Never Smoker  . Smokeless tobacco: Never Used  Substance Use Topics  . Alcohol use: No  . Drug use: No    Allergies:  Allergies  Allergen Reactions  . Demerol [Meperidine] Anaphylaxis  . Morphine And Related Rash    Itching and rash at administration site    Medications Prior to Admission  Medication Sig Dispense Refill Last Dose  . Prenatal Vit-Fe Fumarate-FA (M-NATAL PLUS) 27-1 MG TABS TAKE 1 TABLET BY MOUTH DAILY 30 tablet 1 04/14/2019 at Unknown time  . valACYclovir (VALTREX) 1000 MG tablet TAKE 1 TABLET(1000 MG) BY MOUTH TWICE DAILY 20 tablet 0 04/14/2019 at Unknown time  . omeprazole (PRILOSEC) 20 MG capsule Take 1 capsule (20 mg total) by mouth daily. 30 capsule 0 More than a month at Unknown time    Review of Systems  Constitutional: Negative for chills and fever.  Gastrointestinal: Positive for abdominal pain.  Genitourinary: Negative for dysuria, frequency, urgency, vaginal bleeding and vaginal discharge.  Musculoskeletal: Positive for back pain.   Physical Exam   Blood pressure 116/76, pulse (!) 106, temperature  97.8 F (36.6 C), temperature source Oral, resp. rate 20, height 5\' 7"  (1.702 m), weight (!) 146.7 kg, last menstrual period 08/03/2018, SpO2 100 %, unknown if currently breastfeeding.  Physical Exam  Nursing note and vitals reviewed. Constitutional: She is oriented to person, place, and time. She appears well-developed and well-nourished. No distress.  HENT:  Head: Normocephalic and atraumatic.  Respiratory: Effort normal. No respiratory distress.  GI: Soft. She exhibits no distension. There is no abdominal tenderness.  gravid  Genitourinary:    Genitourinary Comments: 1.5/50 per RN   Musculoskeletal:        General: Normal range of motion.     Cervical back: Normal range of motion.  Neurological: She is alert and oriented to person, place, and time.  Skin: Skin is warm and dry.  Psychiatric: She has a normal mood and affect.  EFM: 140 bpm, mod variability, + accels, no decels Toco: irregular w/irritability  Results for orders placed or performed during the hospital encounter of 04/15/19 (from the past 24 hour(s))  Urinalysis, Routine w reflex microscopic     Status: None   Collection Time: 04/15/19  1:18 PM  Result Value Ref Range   Color, Urine YELLOW YELLOW  APPearance CLEAR CLEAR   Specific Gravity, Urine 1.010 1.005 - 1.030   pH 6.0 5.0 - 8.0   Glucose, UA NEGATIVE NEGATIVE mg/dL   Hgb urine dipstick NEGATIVE NEGATIVE   Bilirubin Urine NEGATIVE NEGATIVE   Ketones, ur NEGATIVE NEGATIVE mg/dL   Protein, ur NEGATIVE NEGATIVE mg/dL   Nitrite NEGATIVE NEGATIVE   Leukocytes,Ua NEGATIVE NEGATIVE   MAU Course  Procedures Meds ordered this encounter  Medications  . cyclobenzaprine (FLEXERIL) tablet 10 mg  . acetaminophen (TYLENOL) tablet 1,000 mg  . butorphanol (STADOL) injection 1 mg  . promethazine (PHENERGAN) injection 12.5 mg   MDM Labs ordered and reviewed. No evidence of UTI or PTL (no cervical change after 2 re-check). Uterine ctx likely d/t mild dehydration from  diarrhea. Suspect viral GI process. May use Kaopectate for diarrhea. Stable for discharge home.   Assessment and Plan   1. [redacted] weeks gestation of pregnancy   2. NST (non-stress test) reactive   3. Gastroenteritis   4. Preterm uterine contractions, antepartum, third trimester    Discharge home Follow up at [facility] next week as scheduled PTL precautions Hydrate with water, broth, juice, jello etc.  Allergies as of 04/15/2019      Reactions   Demerol [meperidine] Anaphylaxis   Morphine And Related Rash   Itching and rash at administration site      Medication List    TAKE these medications   M-Natal Plus 27-1 MG Tabs TAKE 1 TABLET BY MOUTH DAILY   omeprazole 20 MG capsule Commonly known as: PRILOSEC Take 1 capsule (20 mg total) by mouth daily.   valACYclovir 1000 MG tablet Commonly known as: VALTREX TAKE 1 TABLET(1000 MG) BY MOUTH TWICE DAILY      Julianne Handler, CNM 04/15/2019, 12:06 PM

## 2019-04-16 ENCOUNTER — Inpatient Hospital Stay (HOSPITAL_COMMUNITY)
Admission: AD | Admit: 2019-04-16 | Discharge: 2019-04-16 | Disposition: A | Discharge status: Home / Self Care | Payer: Medicaid Other | Attending: Obstetrics and Gynecology | Admitting: Obstetrics and Gynecology

## 2019-04-16 ENCOUNTER — Encounter (HOSPITAL_COMMUNITY): Payer: Self-pay | Admitting: Obstetrics and Gynecology

## 2019-04-16 DIAGNOSIS — Z3A36 36 weeks gestation of pregnancy: Secondary | ICD-10-CM

## 2019-04-16 DIAGNOSIS — O4703 False labor before 37 completed weeks of gestation, third trimester: Secondary | ICD-10-CM

## 2019-04-16 DIAGNOSIS — O479 False labor, unspecified: Secondary | ICD-10-CM | POA: Diagnosis present

## 2019-04-16 NOTE — MAU Provider Note (Signed)
Ms. Olivia Griffith is a [id number] at [redacted]w[redacted]d seen in MAU for labor. RN labor check, not seen by provider. SVE by RN Dilation: 1 Effacement (%): 50 Cervical Position: Posterior Station: -3 Presentation: Undeterminable Exam by:: benji stanleyRN  Cervix unchanged after 1 hour of observation  NST - FHR: 155 bpm / moderate variability / accels present / decels absent / TOCO: regular every 1-3 mins   Plan:  D/C home with labor precautions Keep scheduled appt with [facility] as scheduled  Raelyn Mora, CNM  04/16/2019 8:31 PM

## 2019-04-16 NOTE — MAU Note (Signed)
I have communicated with Raelyn Mora CNM and reviewed vital signs:  Vitals:   04/16/19 2030 04/16/19 2033  BP:  126/64  Pulse:  92  Resp:    Temp:  98.4 F (36.9 C)  SpO2: 97%     Vaginal exam:  Dilation: 1 Effacement (%): 50 Cervical Position: Posterior Station: -3 Presentation: Undeterminable Exam by:: Ocie Stanzione stanleyRN,   Also reviewed contraction pattern and that non-stress test is reactive.  It has been documented that patient is contracting every 3-4 minutes with no cervical change over an hour not indicating active labor.  Patient denies any other complaints.  Based on this report provider has given order for discharge.  A discharge order and diagnosis entered by a provider.   Labor discharge instructions reviewed with patient.

## 2019-04-16 NOTE — MAU Note (Signed)
SAW DR Senaida Ores ON  WED -.  VE  1-2 CM.  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  TAKES VALTREX- DAILY.  DENIES MRSA.  GBS- NEG.  UC STRONG

## 2019-04-26 ENCOUNTER — Telehealth (HOSPITAL_COMMUNITY): Payer: Self-pay | Admitting: *Deleted

## 2019-04-26 NOTE — Telephone Encounter (Signed)
Preadmission screen  

## 2019-04-29 ENCOUNTER — Telehealth (HOSPITAL_COMMUNITY): Payer: Self-pay | Admitting: *Deleted

## 2019-04-29 NOTE — Telephone Encounter (Signed)
Preadmission screen  

## 2019-04-30 ENCOUNTER — Telehealth (HOSPITAL_COMMUNITY): Payer: Self-pay | Admitting: *Deleted

## 2019-04-30 ENCOUNTER — Encounter (HOSPITAL_COMMUNITY): Payer: Self-pay | Admitting: *Deleted

## 2019-04-30 NOTE — Telephone Encounter (Signed)
Preadmission screen  

## 2019-05-01 ENCOUNTER — Other Ambulatory Visit (HOSPITAL_COMMUNITY)
Admission: RE | Admit: 2019-05-01 | Discharge: 2019-05-01 | Disposition: A | Discharge status: Home / Self Care | Payer: Medicaid Other | Source: Ambulatory Visit | Attending: Obstetrics and Gynecology | Admitting: Obstetrics and Gynecology

## 2019-05-01 DIAGNOSIS — Z01812 Encounter for preprocedural laboratory examination: Secondary | ICD-10-CM | POA: Diagnosis present

## 2019-05-01 DIAGNOSIS — Z20822 Contact with and (suspected) exposure to covid-19: Secondary | ICD-10-CM | POA: Diagnosis not present

## 2019-05-01 LAB — SARS CORONAVIRUS 2 (TAT 6-24 HRS): SARS Coronavirus 2: NEGATIVE

## 2019-05-02 ENCOUNTER — Encounter: Payer: Self-pay | Admitting: Obstetrics and Gynecology

## 2019-05-02 ENCOUNTER — Other Ambulatory Visit: Payer: Self-pay | Admitting: Obstetrics and Gynecology
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
## 2019-05-02 NOTE — H&P (Deleted)
  The note originally documented on this encounter has been moved the the encounter in which it belongs.  

## 2019-05-03 ENCOUNTER — Inpatient Hospital Stay (HOSPITAL_COMMUNITY): Payer: Medicaid Other | Admitting: Anesthesiology

## 2019-05-03 ENCOUNTER — Inpatient Hospital Stay (HOSPITAL_COMMUNITY): Payer: Medicaid Other

## 2019-05-03 ENCOUNTER — Encounter (HOSPITAL_COMMUNITY): Payer: Self-pay | Admitting: Obstetrics and Gynecology

## 2019-05-03 ENCOUNTER — Inpatient Hospital Stay (HOSPITAL_COMMUNITY)
Admission: AD | Admit: 2019-05-03 | Discharge: 2019-05-05 | DRG: 806 | Disposition: A | Discharge status: Home / Self Care | Payer: Medicaid Other | Attending: Obstetrics and Gynecology | Admitting: Obstetrics and Gynecology

## 2019-05-03 ENCOUNTER — Other Ambulatory Visit: Payer: Self-pay

## 2019-05-03 DIAGNOSIS — O99214 Obesity complicating childbirth: Principal | ICD-10-CM | POA: Diagnosis present

## 2019-05-03 DIAGNOSIS — Z3A39 39 weeks gestation of pregnancy: Secondary | ICD-10-CM | POA: Diagnosis not present

## 2019-05-03 DIAGNOSIS — A6000 Herpesviral infection of urogenital system, unspecified: Secondary | ICD-10-CM | POA: Diagnosis present

## 2019-05-03 DIAGNOSIS — Z6791 Unspecified blood type, Rh negative: Secondary | ICD-10-CM | POA: Diagnosis not present

## 2019-05-03 DIAGNOSIS — O9832 Other infections with a predominantly sexual mode of transmission complicating childbirth: Secondary | ICD-10-CM | POA: Diagnosis present

## 2019-05-03 DIAGNOSIS — O26893 Other specified pregnancy related conditions, third trimester: Secondary | ICD-10-CM | POA: Diagnosis present

## 2019-05-03 LAB — RPR: RPR Ser Ql: NONREACTIVE

## 2019-05-03 LAB — CBC
HCT: 38.5 % (ref 36.0–46.0)
Hemoglobin: 13 g/dL (ref 12.0–15.0)
MCH: 32.6 pg (ref 26.0–34.0)
MCHC: 33.8 g/dL (ref 30.0–36.0)
MCV: 96.5 fL (ref 80.0–100.0)
Platelets: 178 10*3/uL (ref 150–400)
RBC: 3.99 MIL/uL (ref 3.87–5.11)
RDW: 14.4 % (ref 11.5–15.5)
WBC: 9.4 10*3/uL (ref 4.0–10.5)
nRBC: 0 % (ref 0.0–0.2)

## 2019-05-03 LAB — TYPE AND SCREEN
ABO/RH(D): A NEG
Antibody Screen: NEGATIVE

## 2019-05-03 MED ORDER — BENZOCAINE-MENTHOL 20-0.5 % EX AERO: 1.0000 "application " | INHALATION_SPRAY | CUTANEOUS | Status: DC | PRN

## 2019-05-03 MED ORDER — TERBUTALINE SULFATE 1 MG/ML IJ SOLN: 0.2500 mg | Freq: Once | INTRAMUSCULAR | Status: DC | PRN

## 2019-05-03 MED ORDER — FENTANYL-BUPIVACAINE-NACL 0.5-0.125-0.9 MG/250ML-% EP SOLN
12.0000 mL/h | EPIDURAL | Status: DC | PRN
  Filled 2019-05-03: qty 250

## 2019-05-03 MED ORDER — PHENYLEPHRINE 40 MCG/ML (10ML) SYRINGE FOR IV PUSH (FOR BLOOD PRESSURE SUPPORT): 80.0000 ug | PREFILLED_SYRINGE | INTRAVENOUS | Status: DC | PRN

## 2019-05-03 MED ORDER — SENNOSIDES-DOCUSATE SODIUM 8.6-50 MG PO TABS
2.0000 | ORAL_TABLET | ORAL | Status: DC
  Administered 2019-05-03 – 2019-05-04 (×2): 2 via ORAL
  Filled 2019-05-03 (×2): qty 2

## 2019-05-03 MED ORDER — WITCH HAZEL-GLYCERIN EX PADS: 1.0000 "application " | MEDICATED_PAD | CUTANEOUS | Status: DC | PRN

## 2019-05-03 MED ORDER — IBUPROFEN 600 MG PO TABS
600.0000 mg | ORAL_TABLET | Freq: Four times a day (QID) | ORAL | Status: DC
  Administered 2019-05-03 – 2019-05-05 (×6): 600 mg via ORAL
  Filled 2019-05-03 (×6): qty 1

## 2019-05-03 MED ORDER — LIDOCAINE HCL (PF) 1 % IJ SOLN
INTRAMUSCULAR | Status: DC | PRN
  Administered 2019-05-03: 11 mL via EPIDURAL

## 2019-05-03 MED ORDER — OXYTOCIN 40 UNITS IN NORMAL SALINE INFUSION - SIMPLE MED
2.5000 [IU]/h | INTRAVENOUS | Status: DC
  Filled 2019-05-03: qty 1000

## 2019-05-03 MED ORDER — EPHEDRINE 5 MG/ML INJ: 10.0000 mg | INTRAVENOUS | Status: DC | PRN

## 2019-05-03 MED ORDER — DIBUCAINE (PERIANAL) 1 % EX OINT: 1.0000 "application " | TOPICAL_OINTMENT | CUTANEOUS | Status: DC | PRN

## 2019-05-03 MED ORDER — OXYTOCIN 40 UNITS IN NORMAL SALINE INFUSION - SIMPLE MED
1.0000 m[IU]/min | INTRAVENOUS | Status: DC
  Administered 2019-05-03: 09:00:00 2 m[IU]/min via INTRAVENOUS

## 2019-05-03 MED ORDER — PRENATAL MULTIVITAMIN CH
1.0000 | ORAL_TABLET | Freq: Every day | ORAL | Status: DC
  Administered 2019-05-04: 11:00:00 1 via ORAL
  Filled 2019-05-03: qty 1

## 2019-05-03 MED ORDER — DIPHENHYDRAMINE HCL 25 MG PO CAPS: 25.0000 mg | ORAL_CAPSULE | Freq: Four times a day (QID) | ORAL | Status: DC | PRN

## 2019-05-03 MED ORDER — ACETAMINOPHEN 325 MG PO TABS
650.0000 mg | ORAL_TABLET | ORAL | Status: DC | PRN
  Administered 2019-05-04 (×2): 650 mg via ORAL
  Filled 2019-05-03 (×2): qty 2

## 2019-05-03 MED ORDER — LIDOCAINE HCL (PF) 1 % IJ SOLN: 30.0000 mL | INTRAMUSCULAR | Status: DC | PRN

## 2019-05-03 MED ORDER — COCONUT OIL OIL: 1.0000 "application " | TOPICAL_OIL | Status: DC | PRN

## 2019-05-03 MED ORDER — LACTATED RINGERS IV SOLN: INTRAVENOUS | Status: DC

## 2019-05-03 MED ORDER — OXYTOCIN BOLUS FROM INFUSION
500.0000 mL | Freq: Once | INTRAVENOUS | Status: AC
  Administered 2019-05-03: 500 mL via INTRAVENOUS

## 2019-05-03 MED ORDER — ACETAMINOPHEN 325 MG PO TABS: 650.0000 mg | ORAL_TABLET | ORAL | Status: DC | PRN

## 2019-05-03 MED ORDER — SIMETHICONE 80 MG PO CHEW: 80.0000 mg | CHEWABLE_TABLET | ORAL | Status: DC | PRN

## 2019-05-03 MED ORDER — BUTORPHANOL TARTRATE 1 MG/ML IJ SOLN: 1.0000 mg | INTRAMUSCULAR | Status: DC | PRN

## 2019-05-03 MED ORDER — DIPHENHYDRAMINE HCL 50 MG/ML IJ SOLN: 12.5000 mg | INTRAMUSCULAR | Status: DC | PRN

## 2019-05-03 MED ORDER — ZOLPIDEM TARTRATE 5 MG PO TABS: 5.0000 mg | ORAL_TABLET | Freq: Every evening | ORAL | Status: DC | PRN

## 2019-05-03 MED ORDER — SODIUM CHLORIDE (PF) 0.9 % IJ SOLN
INTRAMUSCULAR | Status: DC | PRN
  Administered 2019-05-03: 12 mL/h via EPIDURAL

## 2019-05-03 MED ORDER — LACTATED RINGERS IV SOLN: 500.0000 mL | INTRAVENOUS | Status: DC | PRN

## 2019-05-03 MED ORDER — TETANUS-DIPHTH-ACELL PERTUSSIS 5-2.5-18.5 LF-MCG/0.5 IM SUSP: 0.5000 mL | Freq: Once | INTRAMUSCULAR | Status: DC

## 2019-05-03 MED ORDER — LACTATED RINGERS IV SOLN: 500.0000 mL | Freq: Once | INTRAVENOUS | Status: DC

## 2019-05-03 MED ORDER — ONDANSETRON HCL 4 MG PO TABS: 4.0000 mg | ORAL_TABLET | ORAL | Status: DC | PRN

## 2019-05-03 MED ORDER — ONDANSETRON HCL 4 MG/2ML IJ SOLN: 4.0000 mg | Freq: Four times a day (QID) | INTRAMUSCULAR | Status: DC | PRN

## 2019-05-03 MED ORDER — ONDANSETRON HCL 4 MG/2ML IJ SOLN: 4.0000 mg | INTRAMUSCULAR | Status: DC | PRN

## 2019-05-03 MED ORDER — SOD CITRATE-CITRIC ACID 500-334 MG/5ML PO SOLN: 30.0000 mL | ORAL | Status: DC | PRN

## 2019-05-03 NOTE — Anesthesia Procedure Notes (Signed)
Epidural Patient location during procedure: OB Start time: 05/03/2019 10:02 AM End time: 05/03/2019 10:14 AM  Staffing Anesthesiologist: Lowella Curb, MD Performed: anesthesiologist   Preanesthetic Checklist Completed: patient identified, IV checked, site marked, risks and benefits discussed, surgical consent, monitors and equipment checked, pre-op evaluation and timeout performed  Epidural Patient position: sitting Prep: ChloraPrep Patient monitoring: heart rate, cardiac monitor, continuous pulse ox and blood pressure Approach: midline Location: L2-L3 Injection technique: LOR saline  Needle:  Needle type: Tuohy  Needle gauge: 17 G Needle length: 9 cm Needle insertion depth: 6 cm Catheter type: closed end flexible Catheter size: 20 Guage Catheter at skin depth: 11 cm Test dose: negative  Assessment Events: blood not aspirated, injection not painful, no injection resistance, no paresthesia and negative IV test  Additional Notes Procedure performed by SRNA under direct supervisionReason for block:procedure for pain

## 2019-05-03 NOTE — Progress Notes (Signed)
Mother to NICU to visit baby, to return to unit before next check

## 2019-05-03 NOTE — Lactation Note (Addendum)
This note was copied from a baby's chart. Lactation Consultation Note  Patient Name: Olivia Griffith [id number] Date: 05/03/2019 Reason for consult: Initial assessment;Term P4, 4 hour term female infant in NICU. Per mom, her 30 year old and two year never latched at breast, she used DEBP and gave them EBM but stopped pumping at  3 months due to low milk supply. Mom is active on the [facility] program in [facility] and has Medela DEBP at home. Mom has been given DEBP while LC in room mom used DEBP and pumped 10 mls of colostrum LC discussed hand expressing after pumping and mom easily expressed additional 5 mls of colostrum that will be taken with Infant labels to NICU. LC notice mom has large nipples that are pseudo inverted.  Mom will follow NICU infant feeding policy. Mom knows to use DEBP every 3 hours for 15 minutes on initial setting and to hand express after pumping to help establish milk supply due infant separation ( infant in NICU).  Mom knows to call RN or LC if she has any further questions or concerns.   Maternal Data Formula Feeding for Exclusion: Yes Reason for exclusion: Mother's choice to formula and breast feed on admission Has patient been taught Hand Expression?: Yes Does the patient have breastfeeding experience prior to this delivery?: Yes  Feeding Feeding Type: Breast Fed  LATCH Score                   Interventions Interventions: Breast feeding basics reviewed;Breast compression;Hand express;Expressed milk;DEBP;Breast massage  Lactation Tools Discussed/Used WIC Program: Yes Pump Review: Setup, frequency, and cleaning;Milk Storage Initiated by:: by RN Date initiated:: 05/03/19   Consult Status Consult Status: Follow-up Date: 05/04/19 Follow-up type: In-patient    [name] 05/03/2019, 8:12 PM

## 2019-05-03 NOTE — Progress Notes (Signed)
Patient ID: Olivia Griffith, female   DOB: 08-17-1990, 29 y.o.   MRN: [id number] Pt here for IOL and not really feeling her mild contractions  afeb VSS FHR category 1  Cervix 50/2-3/-2 AROM clear  Pitocin per protocol, epidural prn

## 2019-05-03 NOTE — Anesthesia Preprocedure Evaluation (Signed)
Anesthesia Evaluation  Patient identified by MRN, date of birth, ID band Patient awake    Reviewed: Allergy & Precautions, NPO status , Patient's Chart, lab work & pertinent test results  Airway Mallampati: III  TM Distance: >3 FB Neck ROM: Full    Dental no notable dental hx.    Pulmonary neg pulmonary ROS,    Pulmonary exam normal breath sounds clear to auscultation       Cardiovascular negative cardio ROS Normal cardiovascular exam Rhythm:Regular Rate:Normal     Neuro/Psych negative neurological ROS  negative psych ROS   GI/Hepatic negative GI ROS, Neg liver ROS,   Endo/Other  Morbid obesity  Renal/GU negative Renal ROS  negative genitourinary   Musculoskeletal negative musculoskeletal ROS (+)   Abdominal (+) + obese,   Peds negative pediatric ROS (+)  Hematology negative hematology ROS (+)   Anesthesia Other Findings   Reproductive/Obstetrics (+) Pregnancy                             Anesthesia Physical  Anesthesia Plan  ASA: III  Anesthesia Plan: Epidural   Post-op Pain Management:    Induction:   PONV Risk Score and Plan: Treatment may vary due to age or medical condition  Airway Management Planned: Natural Airway  Additional Equipment:   Intra-op Plan:   Post-operative Plan:   Informed Consent: I have reviewed the patients History and Physical, chart, labs and discussed the procedure including the risks, benefits and alternatives for the proposed anesthesia with the patient or authorized representative who has indicated his/her understanding and acceptance.       Plan Discussed with: Anesthesiologist  Anesthesia Plan Comments: (Patient identified. Risks, benefits, options discussed with patient including but not limited to bleeding, infection, nerve damage, paralysis, failed block, incomplete pain control, headache, blood pressure changes, nausea, vomiting,  reactions to medication, itching, and post partum back pain. Confirmed with bedside nurse the patient's most recent platelet count. Confirmed with the patient that they are not taking any anticoagulation, have any bleeding history or any family history of bleeding disorders. Patient expressed understanding and wishes to proceed. All questions were answered. )        Anesthesia Quick Evaluation

## 2019-05-03 NOTE — Progress Notes (Signed)
Patient ID: Olivia Griffith, female   DOB: 1990-07-09, 29 y.o.   MRN: [id number] Pt received epidural and comfortable in abdomen but feeling pressure  afeb VSS FHR with early mild decelerations and improved variability  Cervix 80/4-5/-1  Follow progress, seems to be entering active phase

## 2019-05-04 LAB — CBC
HCT: 36.2 % (ref 36.0–46.0)
Hemoglobin: 12.3 g/dL (ref 12.0–15.0)
MCH: 32.8 pg (ref 26.0–34.0)
MCHC: 34 g/dL (ref 30.0–36.0)
MCV: 96.5 fL (ref 80.0–100.0)
Platelets: 164 10*3/uL (ref 150–400)
RBC: 3.75 MIL/uL — ABNORMAL LOW (ref 3.87–5.11)
RDW: 14.6 % (ref 11.5–15.5)
WBC: 12.1 10*3/uL — ABNORMAL HIGH (ref 4.0–10.5)
nRBC: 0 % (ref 0.0–0.2)

## 2019-05-04 NOTE — Progress Notes (Signed)
Patient ID: Olivia Griffith, female   DOB: [birth year], 29 y.o.   MRN: [id number] Pt not in room - visiting baby in NICU Will return to assess or find her in NICU No acute issues per nurse Labs stable this am

## 2019-05-04 NOTE — Lactation Note (Signed)
This note was copied from a baby's chart. Lactation Consultation Note  Patient Name: Boy Emeli Goguen Today's Date: 05/04/2019   [facility] visited with P4 Mom as she was getting ready to transport her EBM to NICU for her baby.  Mom states she is pumping every 2-3 hrs and getting 10-20 ml per pumping.    Mom not to be discharged today.  Mom knows to ask for help prn.   Judee Clara 05/04/2019, 1:04 PM

## 2019-05-04 NOTE — Progress Notes (Signed)
Patient in NICU visiting baby
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
## 2019-05-04 NOTE — Progress Notes (Signed)
Patient ID: Olivia Griffith, female   DOB: 10-16-1990, 29 y.o.   MRN: [id number] Pt doing well- at baby's bedside in NICU. Reports pain well controlled with tylenol. Denies HA, CP, fever, chills or SOB. Has been ambulating with no lightheadedness. Lochia mild. Tolerating diet. No complaints. Baby now on 21% O2 with plans to consider feeds today VSS GEN - NAD ABD - deferred due to in NICU EXT - mild edema noted  12.1>12.3<164  A/P: PPD#1 s/p svd - stable         Routine pp care

## 2019-05-05 LAB — SURGICAL PATHOLOGY

## 2019-05-05 MED ORDER — IBUPROFEN 600 MG PO TABS: 600.0000 mg | ORAL_TABLET | Freq: Four times a day (QID) | ORAL | 0 refills | Status: AC

## 2019-05-05 NOTE — Progress Notes (Signed)
PPD #2 Seen in NICU Doing well, baby stable Afeb, VSS Fundus firm Discharge home

## 2019-05-05 NOTE — Discharge Instructions (Signed)
As per discharge pamphlet °

## 2019-05-05 NOTE — Anesthesia Postprocedure Evaluation (Signed)
Anesthesia Post Note  Patient: Olivia Griffith  Procedure(s) Performed: AN AD HOC LABOR EPIDURAL     Patient location during evaluation: Mother Baby Anesthesia Type: Epidural Level of consciousness: awake, awake and alert and oriented Pain management: pain level controlled Vital Signs Assessment: post-procedure vital signs reviewed and stable Respiratory status: spontaneous breathing, nonlabored ventilation and respiratory function stable Cardiovascular status: stable Postop Assessment: no headache, patient able to bend at knees, no apparent nausea or vomiting, adequate PO intake, able to ambulate and no backache Anesthetic complications: no    Last Vitals:  Vitals:   05/04/19 2205 05/05/19 0513  BP: 112/78 (!) 143/69  Pulse: 77 80  Resp: 19 16  Temp: 36.9 C 36.6 C  SpO2: 97%     Last Pain:  Vitals:   05/05/19 0515  TempSrc:   PainSc: 0-No pain   Pain Goal:                   Tavita Eastham

## 2019-05-05 NOTE — Lactation Note (Addendum)
This note was copied from a baby's chart. Lactation Consultation Note  Patient Name: Olivia Griffith Date: 05/05/2019 Reason for consult: Follow-up assessment;Other (Comment);NICU baby;Term(for D/C today - per mom plans to go stay with baby in NICU) LC visited mom in her room 407 and per mom is being D/C today.  Per  Mom has been pumping off 15 ml consistently.  LC reviewed once the volume increases to 20 ml x 3 to increase to the maintenance mode. LC reviewed the settings on the pump.  Mom denies sore nipples. Sore nipple and engorgement prevention and tx and supply and demand.  Reviewed. Per mom has experienced engorgement before.  LC provided reusable ice packs x 2 with instructions.  Per mom will have a DEBP - Medela for home.  LC reassured mom LC is available in NICU when the baby is able to latch and to have the NICU RN call to set up the appointment.   Maternal Data    Feeding Feeding Type: Donor Breast Milk  LATCH Score                   Interventions    Lactation Tools Discussed/Used Tools: Pump Breast pump type: Double-Electric Breast Pump WIC Program: Yes Pump Review: Milk Storage;Setup, frequency, and cleaning Initiated by:: MAI - reviewed   Consult Status Consult Status: PRN Date: (baby in NICU) Follow-up type: In-patient    Olivia Griffith 05/05/2019, 9:00 AM

## 2019-05-05 NOTE — Discharge Summary (Signed)
OB Discharge Summary     Patient Name: Olivia Griffith DOB: 1991/03/20 MRN: [id number]  Date of admission: 05/03/2019 Delivering MD: Huel Cote   Date of discharge: 05/05/2019  Admitting diagnosis: Indication for care in labor and delivery, antepartum [O75.9] NSVD (normal spontaneous vaginal delivery) [O80] Intrauterine pregnancy: [redacted]w[redacted]d     Secondary diagnosis:  Active Problems:   Indication for care in labor and delivery, antepartum   NSVD (normal spontaneous vaginal delivery)      Discharge diagnosis: Term Pregnancy Delivered                                                                                                Hospital course:  Induction of Labor With Vaginal Delivery   29 y.o. yo [phone] at [redacted]w[redacted]d was admitted to the hospital 05/03/2019 for induction of labor.  Indication for induction: Favorable cervix at term.  Patient had an uncomplicated labor course as follows: Membrane Rupture Time/Date: 9:06 AM ,05/03/2019   Intrapartum Procedures: Episiotomy: None [1]                                         Lacerations:  None [1]  Patient had delivery of a Viable infant.  Information for the patient's newborn:  Katana, Berthold [[id number]]  Delivery Method: Vaginal, Spontaneous(Filed from Delivery Summary)    05/03/2019  Details of delivery can be found in separate delivery note.  Patient had a routine postpartum course, baby required NICU admission. Patient is discharged home 05/05/19.  Physical exam  Vitals:   05/04/19 0229 05/04/19 0624 05/04/19 2205 05/05/19 0513  BP: 132/87 (!) 131/57 112/78 (!) 143/69  Pulse: 94 79 77 80  Resp: 16 18 19 16   Temp: 97.7 F (36.5 C) 98.1 F (36.7 C) 98.4 F (36.9 C) 97.8 F (36.6 C)  TempSrc: Oral Oral Oral Oral  SpO2:   97%   Weight:      Height:       General: alert Lochia: appropriate Uterine Fundus: firm  Labs: Lab Results  Component Value Date   WBC 12.1 (H) 05/04/2019   HGB 12.3 05/04/2019   HCT 36.2  05/04/2019   MCV 96.5 05/04/2019   PLT 164 05/04/2019   CMP Latest Ref Rng & Units 08/29/2016  Glucose 65 - 99 mg/dL 08/31/2016)  BUN 6 - 20 mg/dL 5(L)  Creatinine 250(N - 1.00 mg/dL 3.97  Sodium 6.73 - 419 mmol/L 137  Potassium 3.5 - 5.1 mmol/L 3.9  Chloride 101 - 111 mmol/L 106  CO2 22 - 32 mmol/L 22  Calcium 8.9 - 10.3 mg/dL 9.7  Total Protein 6.5 - 8.1 g/dL 7.3  Total Bilirubin 0.3 - 1.2 mg/dL 379)  Alkaline Phos 38 - 126 U/L 99  AST 15 - 41 U/L 196(H)  ALT 14 - 54 U/L 75(H)    Discharge instruction: per After Visit Summary and "Baby and Me Booklet".  After visit meds:  Allergies as of 05/05/2019      Reactions   Demerol [meperidine]  Anaphylaxis   Morphine And Related Rash   Itching and rash at administration site      Medication List    STOP taking these medications   valACYclovir 1000 MG tablet Commonly known as: VALTREX     TAKE these medications   ibuprofen 600 MG tablet Commonly known as: ADVIL Take 1 tablet (600 mg total) by mouth every 6 (six) hours.   M-Natal Plus 27-1 MG Tabs TAKE 1 TABLET BY MOUTH DAILY       Diet: routine diet  Activity: Advance as tolerated. Pelvic rest for 6 weeks.   Outpatient follow up:6 weeks  Newborn Data: Live born female  Birth Weight: 7 lb 9 oz (3430 g) APGAR: 5, 8  Newborn Delivery   Birth date/time: 05/03/2019 15:13:00 Delivery type: Vaginal, Spontaneous      Baby Feeding: Breast Disposition:NICU   05/05/2019 Clarene Duke, MD

## 2019-05-08 ENCOUNTER — Ambulatory Visit: Payer: Self-pay

## 2019-05-08 NOTE — Lactation Note (Signed)
This note was copied from a baby's chart. Lactation Consultation Note  Patient Name: Olivia Griffith SUORV'I Date: 05/08/2019 Reason for consult: Follow-up assessment;NICU baby;Term  Visited with 20 days old FT NICU female, baby is going home today, mom was heading out the door when [facility] arrived to the room. She's still pumping, baby is currently getting both, breastmilk and Similac 20 calorie count formula. Dad present in the room during [facility] consultation. Mom reported she didn't have any questions or concerns at this point, and that she was aware of LC OP services in case needed. She has our phone number to contact lactation PRN.   Maternal Data    Feeding    LATCH Score                   Interventions    Lactation Tools Discussed/Used     Consult Status Consult Status: Complete Date: 05/08/19 Follow-up type: Call as needed    Jessalynn Mccowan Venetia Constable 05/08/2019, 12:22 PM

## 2019-06-24 ENCOUNTER — Emergency Department (HOSPITAL_BASED_OUTPATIENT_CLINIC_OR_DEPARTMENT_OTHER): Payer: Medicaid Other

## 2019-06-24 ENCOUNTER — Encounter (HOSPITAL_BASED_OUTPATIENT_CLINIC_OR_DEPARTMENT_OTHER): Payer: Self-pay | Admitting: Emergency Medicine

## 2019-06-24 ENCOUNTER — Other Ambulatory Visit: Payer: Self-pay

## 2019-06-24 ENCOUNTER — Emergency Department (HOSPITAL_BASED_OUTPATIENT_CLINIC_OR_DEPARTMENT_OTHER)
Admission: EM | Admit: 2019-06-24 | Discharge: 2019-06-24 | Disposition: A | Discharge status: Home / Self Care | Payer: Medicaid Other | Attending: Emergency Medicine | Admitting: Emergency Medicine

## 2019-06-24 DIAGNOSIS — S63501A Unspecified sprain of right wrist, initial encounter: Secondary | ICD-10-CM | POA: Insufficient documentation

## 2019-06-24 DIAGNOSIS — Z79899 Other long term (current) drug therapy: Secondary | ICD-10-CM | POA: Diagnosis not present

## 2019-06-24 DIAGNOSIS — S39012A Strain of muscle, fascia and tendon of lower back, initial encounter: Secondary | ICD-10-CM | POA: Insufficient documentation

## 2019-06-24 DIAGNOSIS — Y92410 Unspecified street and highway as the place of occurrence of the external cause: Secondary | ICD-10-CM | POA: Insufficient documentation

## 2019-06-24 DIAGNOSIS — Y999 Unspecified external cause status: Secondary | ICD-10-CM | POA: Insufficient documentation

## 2019-06-24 DIAGNOSIS — Y9389 Activity, other specified: Secondary | ICD-10-CM | POA: Diagnosis not present

## 2019-06-24 DIAGNOSIS — S3992XA Unspecified injury of lower back, initial encounter: Secondary | ICD-10-CM | POA: Diagnosis present

## 2019-06-24 LAB — PREGNANCY, URINE: Preg Test, Ur: NEGATIVE
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
## 2019-06-24 NOTE — Discharge Instructions (Addendum)
Take Tylenol as needed for the pain 

## 2019-10-22 ENCOUNTER — Other Ambulatory Visit: Payer: Self-pay | Admitting: Student

## 2019-10-22 DIAGNOSIS — B009 Herpesviral infection, unspecified: Secondary | ICD-10-CM

## 2020-02-06 ENCOUNTER — Other Ambulatory Visit: Payer: Self-pay | Admitting: Student

## 2020-02-06 DIAGNOSIS — B009 Herpesviral infection, unspecified: Secondary | ICD-10-CM

## 2020-10-22 IMAGING — US US OB < 14 WEEKS - US OB TV
1 series · 15 of 28 positions shown · non-contrast
Comparison: None.

CLINICAL DATA: Vaginal bleeding in 1st trimester pregnancy.
Gestational age by LMP of 10 weeks 6 days.

EXAM:
OBSTETRIC <14 WK US AND TRANSVAGINAL OB US
TECHNIQUE: Both transabdominal and transvaginal ultrasound examinations were
performed for complete evaluation of the gestation as well as the
maternal uterus, adnexal regions, and pelvic cul-de-sac.
Transvaginal technique was performed to assess early pregnancy.

[Series 1: us ob < 14 weeks - us ob tv · 62 acquisitions, 15 frames shown]
[im 1/62]
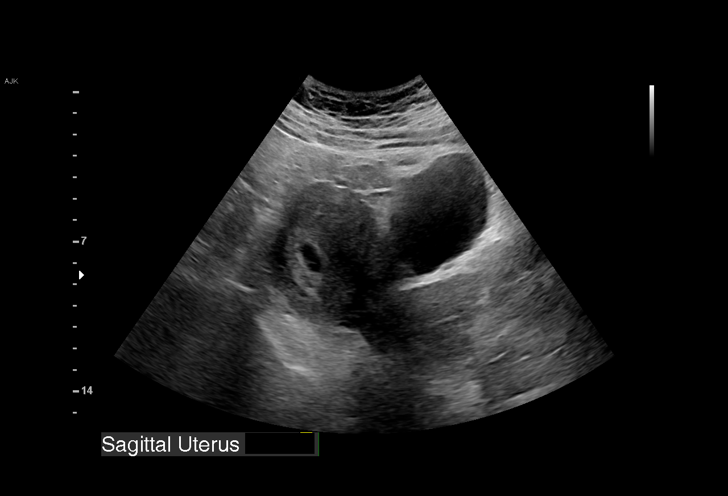
[im 5/62]
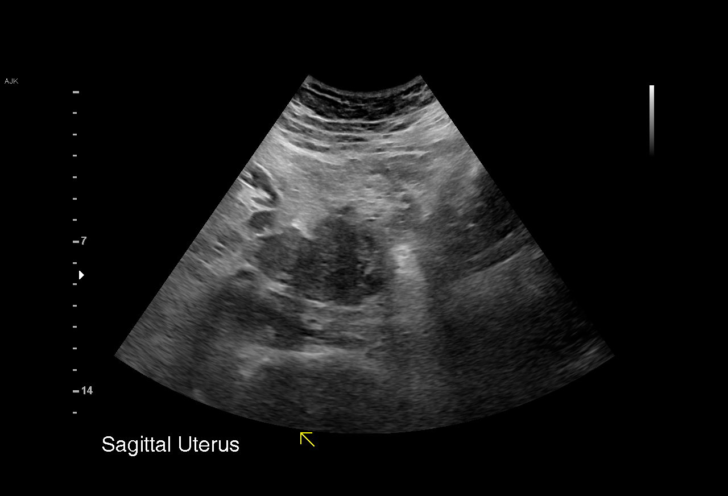
[im 10/62]
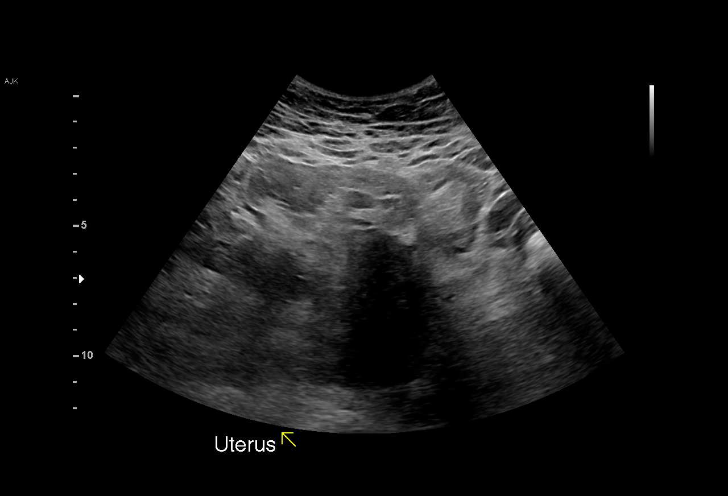
[im 14/62]
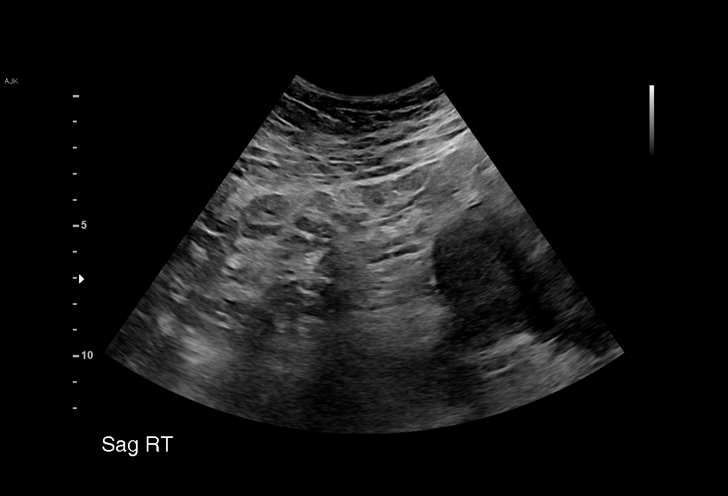
[im 19/62]
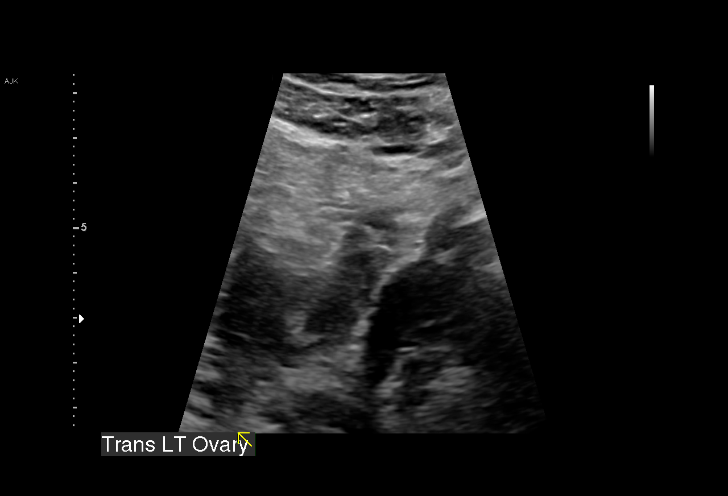
[im 23/62]
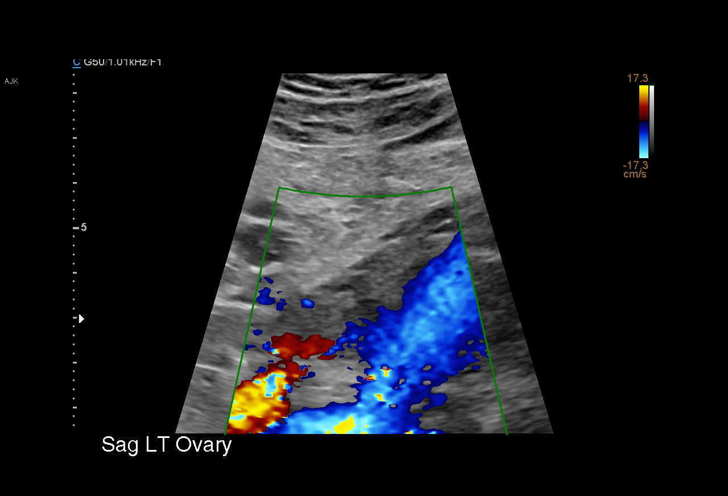
[im 28/62]
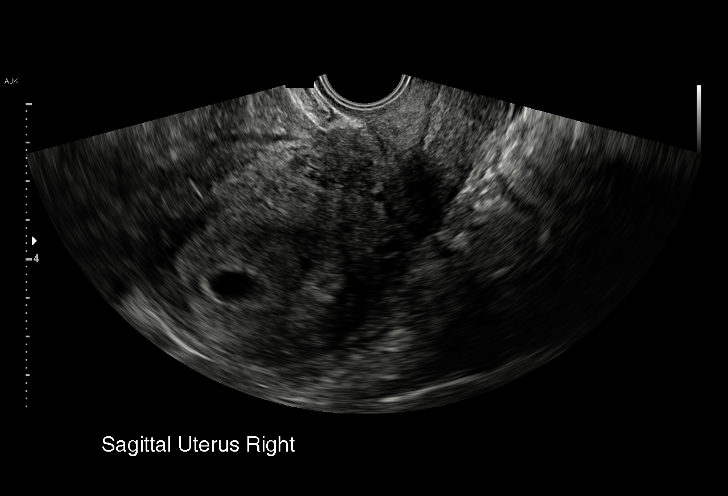
[im 32/62]
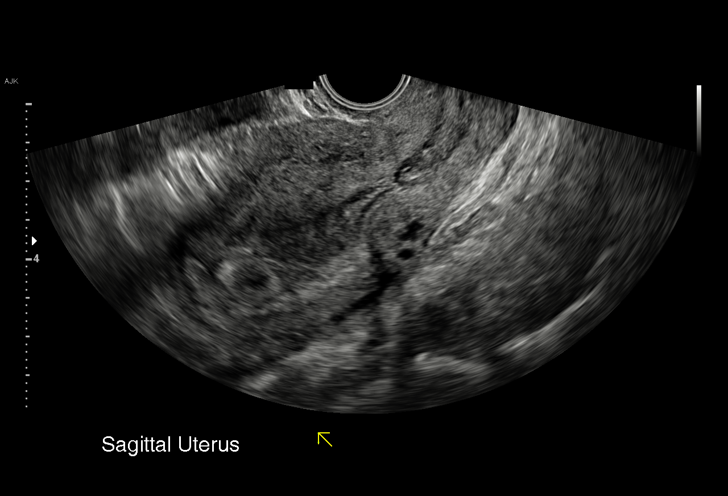
[im 34/62]
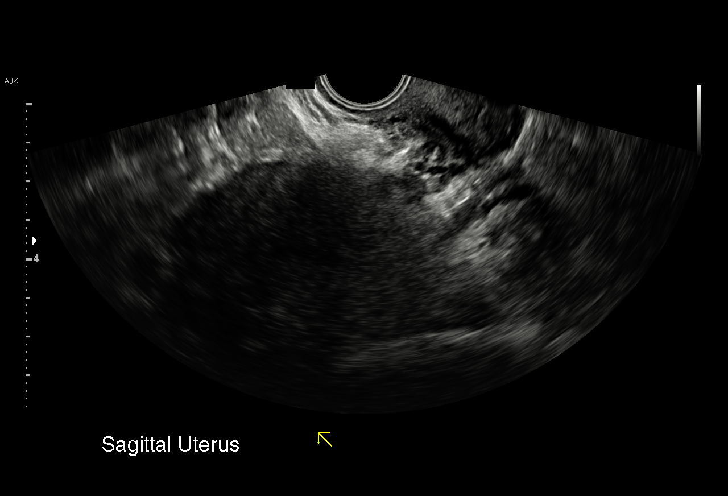
[im 39/62]
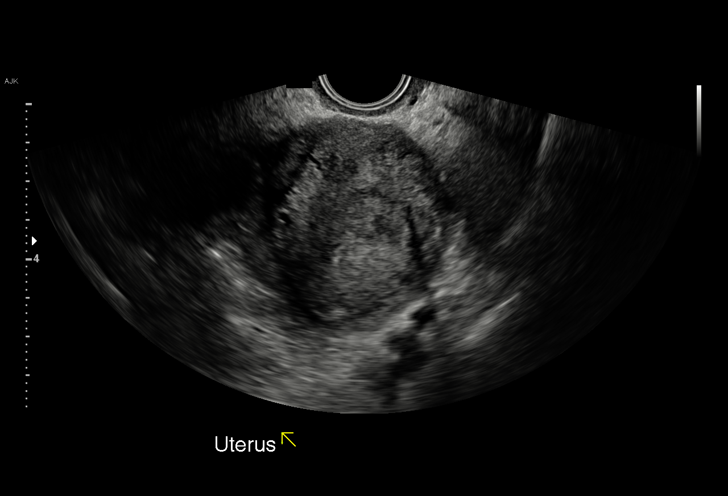
[im 43/62]
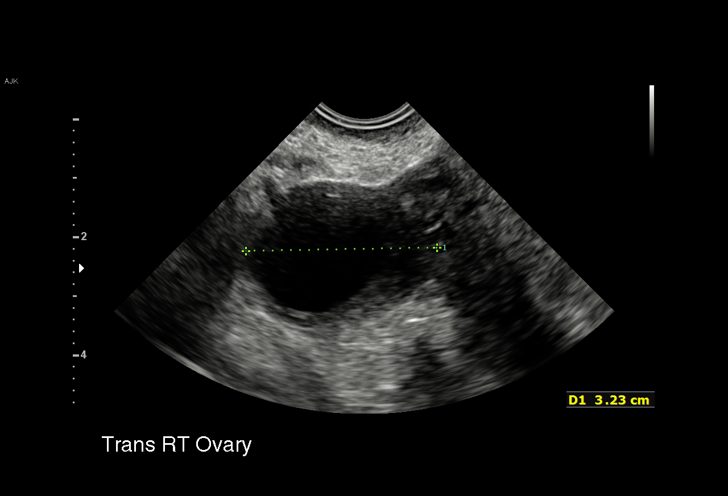
[im 48/62]
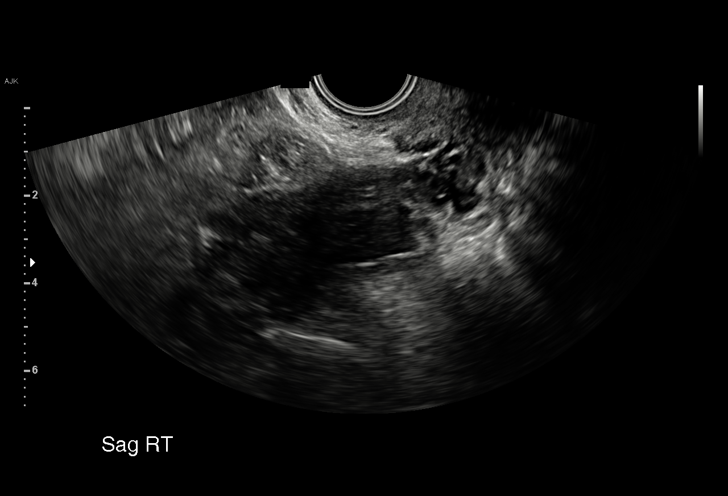
[im 52/62]
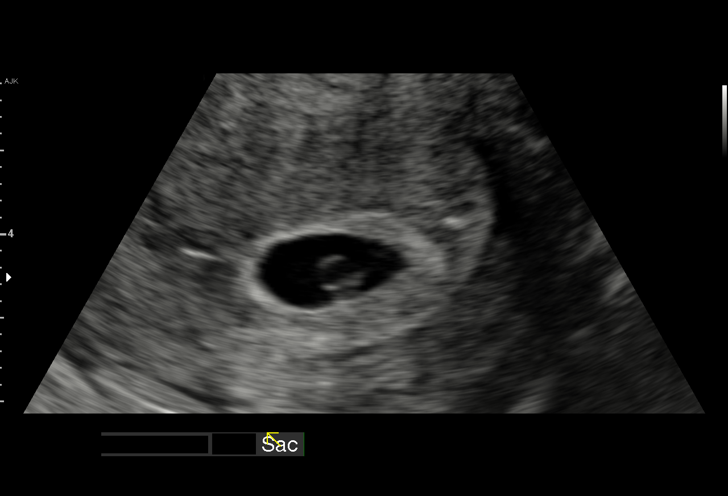
[im 57/62]
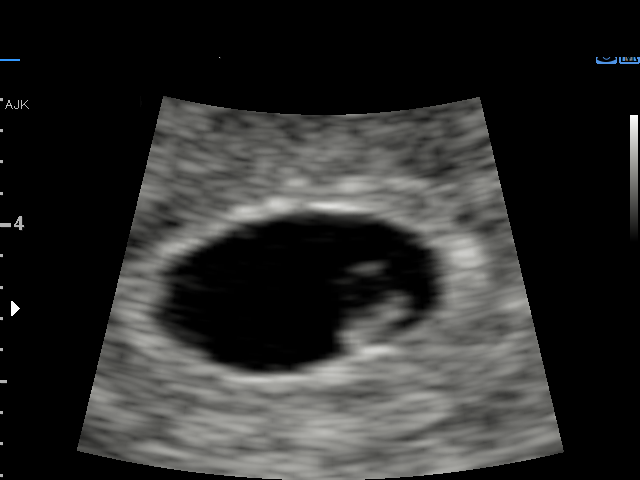
[im 62/62]
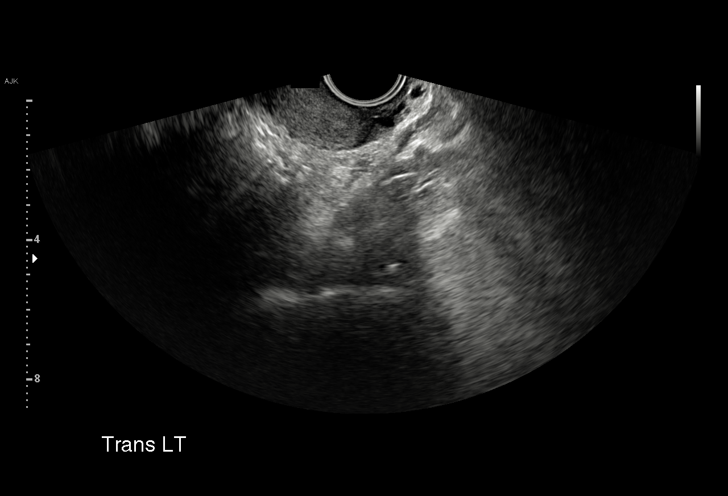

[15 of 28 positions shown; findings below may reference images not displayed]

FINDINGS: Intrauterine gestational sac: Single

Yolk sac:  Visualized.

Embryo:  Visualized.

Cardiac Activity: Visualized.

Heart Rate: 119 bpm

CRL:  6 mm   6 w   1 d                  US EDC: 11/17/2018

Subchorionic hemorrhage:  None visualized.

Maternal uterus/adnexae: Normal appearance of right ovary. Left
ovary was not directly visualized, however no adnexal mass
identified. No abnormal free-fluid.
IMPRESSION: Single living IUP measuring 6 weeks 1 day, with US EDC of
11/17/2018.

No significant maternal uterine or adnexal abnormality identified.

## 2021-10-09 ENCOUNTER — Emergency Department (HOSPITAL_COMMUNITY)
Admission: EM | Admit: 2021-10-09 | Discharge: 2021-10-09 | Disposition: A | Discharge status: Home / Self Care | Payer: Medicaid Other | Attending: Emergency Medicine | Admitting: Emergency Medicine

## 2021-10-09 ENCOUNTER — Encounter (HOSPITAL_COMMUNITY): Payer: Self-pay

## 2021-10-09 ENCOUNTER — Other Ambulatory Visit: Payer: Self-pay

## 2021-10-09 DIAGNOSIS — R55 Syncope and collapse: Secondary | ICD-10-CM | POA: Insufficient documentation

## 2021-10-09 DIAGNOSIS — R519 Headache, unspecified: Secondary | ICD-10-CM | POA: Insufficient documentation

## 2021-10-09 DIAGNOSIS — R42 Dizziness and giddiness: Secondary | ICD-10-CM | POA: Diagnosis present

## 2021-10-09 LAB — URINALYSIS, ROUTINE W REFLEX MICROSCOPIC
Bilirubin Urine: NEGATIVE
Glucose, UA: NEGATIVE mg/dL
Ketones, ur: NEGATIVE mg/dL
Nitrite: NEGATIVE
Protein, ur: NEGATIVE mg/dL
Specific Gravity, Urine: 1.014 (ref 1.005–1.030)
pH: 6 (ref 5.0–8.0)

## 2021-10-09 LAB — CBC
HCT: 39.9 % (ref 36.0–46.0)
Hemoglobin: 13 g/dL (ref 12.0–15.0)
MCH: 29.8 pg (ref 26.0–34.0)
MCHC: 32.6 g/dL (ref 30.0–36.0)
MCV: 91.5 fL (ref 80.0–100.0)
Platelets: 243 10*3/uL (ref 150–400)
RBC: 4.36 MIL/uL (ref 3.87–5.11)
RDW: 13.2 % (ref 11.5–15.5)
WBC: 9.4 10*3/uL (ref 4.0–10.5)
nRBC: 0 % (ref 0.0–0.2)

## 2021-10-09 LAB — BASIC METABOLIC PANEL
Anion gap: 7 (ref 5–15)
BUN: 12 mg/dL (ref 6–20)
CO2: 22 mmol/L (ref 22–32)
Calcium: 9 mg/dL (ref 8.9–10.3)
Chloride: 108 mmol/L (ref 98–111)
Creatinine, Ser: 0.83 mg/dL (ref 0.44–1.00)
GFR, Estimated: >60 mL/min (ref >60)
Glucose, Bld: 101 mg/dL — ABNORMAL HIGH (ref 70–99)
Potassium: 3.6 mmol/L (ref 3.5–5.1)
Sodium: 137 mmol/L (ref 135–145)

## 2021-10-09 LAB — I-STAT BETA HCG BLOOD, ED (MC, WL, AP ONLY): I-stat hCG, quantitative: <5 m[IU]/mL (ref <5)

## 2021-10-09 LAB — CBG MONITORING, ED: Glucose-Capillary: 112 mg/dL — ABNORMAL HIGH (ref 70–99)

## 2021-10-09 MED ORDER — DIPHENHYDRAMINE HCL 50 MG/ML IJ SOLN
25.0000 mg | Freq: Once | INTRAMUSCULAR | Status: AC
  Administered 2021-10-09: 25 mg via INTRAVENOUS
  Filled 2021-10-09: qty 1

## 2021-10-09 MED ORDER — ONDANSETRON HCL 4 MG/2ML IJ SOLN
4.0000 mg | Freq: Once | INTRAMUSCULAR | Status: AC
  Administered 2021-10-09: 4 mg via INTRAVENOUS
  Filled 2021-10-09: qty 2

## 2021-10-09 MED ORDER — ACETAMINOPHEN 500 MG PO TABS
1000.0000 mg | ORAL_TABLET | Freq: Once | ORAL | Status: AC
  Administered 2021-10-09: 1000 mg via ORAL
  Filled 2021-10-09: qty 2

## 2021-10-09 MED ORDER — SODIUM CHLORIDE 0.9 % IV BOLUS
1000.0000 mL | Freq: Once | INTRAVENOUS | Status: AC
  Administered 2021-10-09: 1000 mL via INTRAVENOUS

## 2021-10-09 NOTE — ED Provider Notes (Signed)
[facility]-EMERGENCY DEPT Provider Note   CSN: [id number] Arrival date & time: 10/09/21  1659     History  Chief Complaint  Patient presents with  . Dizziness    Olivia Griffith is a 31 y.o. female who presents to the ED complaining of feeling lightheaded onset today. Pt notes that she was at an outdoor festival when she began to experience her symptoms. No meds tried PTA. Denies ETOH use.  Has associated headache.  She notes her headache is throbbing and localized to the frontal region.  Relative who was with the patient noted that the patient appeared to have passed out earlier today.  Patient notes that she had been out all day today running errands and then went to the outdoor festival afterwards.  Patient denies fever, vision changes (blurred or double vision), photophobia, cough, palpitations, urinary symptoms, vomiting, dizziness.    The history is provided by the patient. No language interpreter was used.       Home Medications Prior to Admission medications   Medication Sig Start Date End Date Taking? Authorizing Provider  ibuprofen (ADVIL) 600 MG tablet Take 1 tablet (600 mg total) by mouth every 6 (six) hours. 05/05/19   Meisinger, Tawanna Cooler, MD  Prenatal Vit-Fe Fumarate-FA (M-NATAL PLUS) 27-1 MG TABS TAKE 1 TABLET BY MOUTH DAILY Patient taking differently: Take 1 tablet by mouth daily.  09/15/18   Aviva Signs, CNM      Allergies    Demerol [meperidine] and Morphine and related    Review of Systems   Review of Systems  Constitutional:  Negative for fever.  Eyes:  Negative for photophobia and visual disturbance.  Respiratory:  Negative for cough.   Cardiovascular:  Negative for palpitations.  Gastrointestinal:  Positive for nausea. Negative for abdominal pain and vomiting.  Genitourinary:  Negative for dysuria and hematuria.  Neurological:  Positive for light-headedness and headaches. Negative for dizziness.  All other systems reviewed and are  negative.   Physical Exam Updated Vital Signs BP (!) 145/68 (BP Location: Left Arm)   Pulse 90   Temp 98.5 F (36.9 C) (Oral)   Resp 16   Ht 5\' 7"  (1.702 m)   Wt (!) 144 kg   SpO2 98%   BMI 49.72 kg/m  Physical Exam Vitals and nursing note reviewed.  Constitutional:      General: She is not in acute distress.    Appearance: She is not diaphoretic.  HENT:     Head: Normocephalic and atraumatic.     Mouth/Throat:     Pharynx: No oropharyngeal exudate.  Eyes:     General: No scleral icterus.    Conjunctiva/sclera: Conjunctivae normal.  Cardiovascular:     Rate and Rhythm: Normal rate and regular rhythm.     Pulses: Normal pulses.     Heart sounds: Normal heart sounds.  Pulmonary:     Effort: Pulmonary effort is normal. No respiratory distress.     Breath sounds: Normal breath sounds. No wheezing.  Abdominal:     General: Bowel sounds are normal.     Palpations: Abdomen is soft. There is no mass.     Tenderness: There is no abdominal tenderness. There is no guarding or rebound.  Musculoskeletal:        General: Normal range of motion.     Cervical back: Normal range of motion and neck supple.     Comments: Able to ambulate without assistance or difficulty.  Skin:    General: Skin is  warm and dry.  Neurological:     Mental Status: She is alert.  Psychiatric:        Behavior: Behavior normal.     ED Results / Procedures / Treatments   Labs (all labs ordered are listed, but only abnormal results are displayed) Labs Reviewed  BASIC METABOLIC PANEL - Abnormal; Notable for the following components:      Result Value   Glucose, Bld 101 (*)    All other components within normal limits  URINALYSIS, ROUTINE W REFLEX MICROSCOPIC - Abnormal; Notable for the following components:   APPearance HAZY (*)    Hgb urine dipstick SMALL (*)    Leukocytes,Ua LARGE (*)    Bacteria, UA RARE (*)    All other components within normal limits  CBG MONITORING, ED - Abnormal; Notable  for the following components:   Glucose-Capillary 112 (*)    All other components within normal limits  CBC  I-STAT BETA HCG BLOOD, ED ([facility], [facility], [facility] ONLY)    EKG EKG Interpretation  Date/Time:  Tuesday October 09 2021 18:38:39 EDT Ventricular Rate:  88 PR Interval:  178 QRS Duration: 81 QT Interval:  353 QTC Calculation: 428 R Axis:   77 Text Interpretation: Sinus rhythm Confirmed by Alona Bene [phone]) on 10/09/2021 6:58:25 PM  Radiology No results found.  Procedures Procedures    Medications Ordered in ED Medications  sodium chloride 0.9 % bolus 1,000 mL (0 mLs Intravenous Stopped 10/09/21 1924)  diphenhydrAMINE (BENADRYL) injection 25 mg (25 mg Intravenous Given 10/09/21 1811)  ondansetron (ZOFRAN) injection 4 mg (4 mg Intravenous Given 10/09/21 1812)  acetaminophen (TYLENOL) tablet 1,000 mg (1,000 mg Oral Given 10/09/21 1812)    ED Course/ Medical Decision Making/ A&P Clinical Course as of 10/09/21 2047  Tue Oct 09, 2021  1900 Pt re-evaluated and noted improvement of symptoms with treatment regimen in the ED.  [SB]  2014 Discussed with patient remaining of labs and discharge treatment plan.  Answered all available questions.  Patient appears safe for discharge at this time. [SB]    Clinical Course User Index [SB] Trampus Mcquerry A, PA-C                           Medical Decision Making Amount and/or Complexity of Data Reviewed Labs: ordered.  Risk OTC drugs. Prescription drug management.   Pt presents with concerns for syncope onset prior to arrival.  Patient notes that she had been outside all day long prior to the onset of her symptoms.  Denies EtOH use.  Vital signs stable, patient afebrile, not tachycardic or hypoxic.  On exam patient without acute cardiovascular, respiratory, dull exam findings.  Differential diagnosis includes anemia, arrhythmia, electrolyte abnormality, dehydration, acute cystitis.   Additional history obtained:  Additional history obtained from  relative who notes that patient appeared to pass out.  EKG: I personally ordered and interpreted EKG, pertinent results as follows: Normal sinus rhythm, no acute ST/T changes.  Labs:  I ordered, and personally interpreted labs.  The pertinent results include:   Urinalysis notable for small amount of hemoglobin, large leukocytes otherwise nitrate negative.  (Patient is due to start her menstrual cycle 7). I-STAT beta-hCG less than 5 and unremarkable. CBG at 112 unremarkable. BMP with slightly elevated glucose at 101 otherwise unremarkable. CBC unremarkable   Medications:  I ordered medication including IV fluids, Zofran, Tylenol, Benadryl for symptom management Reevaluation of the patient after these medicines and interventions, I reevaluated  the patient and found that they have improved I have reviewed the patients home medicines and have made adjustments as needed   Disposition: Presentation suspicious for syncope, likely due to dehydration in the setting of being out all day in the heat of the day and minimal water intake.  Doubt arrhythmia, anemia, electrolyte abnormality, acute cystitis, at this time. After consideration of the diagnostic results and the patients response to treatment, I feel that the patient would benefit from Discharge home. Supportive care measures and strict return precautions discussed with patient at bedside. Pt acknowledges and verbalizes understanding. Pt appears safe for discharge. Follow up as indicated in discharge paperwork.    This chart was dictated using voice recognition software, Dragon. Despite the best efforts of this provider to proofread and correct errors, errors may still occur which can change documentation meaning.  Final Clinical Impression(s) / ED Diagnoses Final diagnoses:  Syncope, unspecified syncope type    Rx / DC Orders ED Discharge Orders     None         Doraine Schexnider A, PA-C 10/09/21 2047    Milagros Loll,  MD 10/09/21 2236

## 2021-10-09 NOTE — ED Notes (Signed)
Pt able to ambulate easily, independently and without any dizziness reported.

## 2021-10-09 NOTE — ED Triage Notes (Signed)
Ems brings pt in from outdoor festival for dizziness. Pt felt like she was going to pass out so she sat down. Pt admits to drinking alcohol today.

## 2021-10-09 NOTE — Discharge Instructions (Addendum)
It was a pleasure to take care of you today!  Your labs are overall unremarkable today.  Ensure to maintain fluid intake with water.  You may call your primary care provider and set up a follow-up appointment as needed.  Return to the emergency department if you are experiencing increasing/worsening headache, vision changes, passing out, or worsening symptoms.

## 2022-06-05 ENCOUNTER — Encounter (HOSPITAL_BASED_OUTPATIENT_CLINIC_OR_DEPARTMENT_OTHER): Payer: Self-pay

## 2022-06-05 ENCOUNTER — Other Ambulatory Visit: Payer: Self-pay

## 2022-06-05 ENCOUNTER — Emergency Department (HOSPITAL_BASED_OUTPATIENT_CLINIC_OR_DEPARTMENT_OTHER)
Admission: EM | Admit: 2022-06-05 | Discharge: 2022-06-05 | Disposition: A | Discharge status: Home / Self Care | Payer: Medicaid Other | Attending: Emergency Medicine | Admitting: Emergency Medicine

## 2022-06-05 DIAGNOSIS — J020 Streptococcal pharyngitis: Secondary | ICD-10-CM | POA: Insufficient documentation

## 2022-06-05 DIAGNOSIS — H6693 Otitis media, unspecified, bilateral: Secondary | ICD-10-CM | POA: Diagnosis not present

## 2022-06-05 DIAGNOSIS — Z1152 Encounter for screening for COVID-19: Secondary | ICD-10-CM | POA: Diagnosis not present

## 2022-06-05 DIAGNOSIS — J029 Acute pharyngitis, unspecified: Secondary | ICD-10-CM | POA: Diagnosis present

## 2022-06-05 DIAGNOSIS — H6690 Otitis media, unspecified, unspecified ear: Secondary | ICD-10-CM

## 2022-06-05 LAB — RESP PANEL BY RT-PCR (RSV, FLU A&B, COVID)  RVPGX2
Influenza A by PCR: NEGATIVE
Influenza B by PCR: NEGATIVE
Resp Syncytial Virus by PCR: NEGATIVE
SARS Coronavirus 2 by RT PCR: NEGATIVE

## 2022-06-05 LAB — GROUP A STREP BY PCR: Group A Strep by PCR: DETECTED — AB

## 2022-06-05 MED ORDER — PREDNISONE 20 MG PO TABS: 40.0000 mg | ORAL_TABLET | ORAL | 0 refills | Status: AC

## 2022-06-05 MED ORDER — AMOXICILLIN 500 MG PO CAPS: 1000.0000 mg | ORAL_CAPSULE | Freq: Two times a day (BID) | ORAL | 0 refills | Status: AC

## 2022-06-05 MED ORDER — DEXAMETHASONE SODIUM PHOSPHATE 10 MG/ML IJ SOLN
10.0000 mg | Freq: Once | INTRAMUSCULAR | Status: AC
  Administered 2022-06-05: 10 mg via INTRAMUSCULAR
  Filled 2022-06-05: qty 1

## 2022-06-05 MED ORDER — LIDOCAINE VISCOUS HCL 2 % MT SOLN
15.0000 mL | Freq: Once | OROMUCOSAL | Status: AC
  Administered 2022-06-05: 15 mL via OROMUCOSAL
  Filled 2022-06-05: qty 15

## 2022-06-05 MED ORDER — FLUCONAZOLE 200 MG PO TABS: 200.0000 mg | ORAL_TABLET | Freq: Once | ORAL | 0 refills | Status: AC

## 2022-06-05 NOTE — Discharge Instructions (Signed)
Return to the ED with any new or worsening signs or symptoms Please begin taking steroids tomorrow.  You will take 40 mg steroids for the next 4 days starting tomorrow.  You have already received steroids here today. Please continue with warm salt water rinses, please read attached guide concerning strep throat as well as otitis media for further information Please see attached work note Please begin taking amoxicillin 1000 mg twice daily beginning today.  You will be on this for 7 days.

## 2022-06-05 NOTE — ED Triage Notes (Signed)
"  Sore throat x 2 days and is now making my ears hurt, congestion as well" per pt

## 2022-06-05 NOTE — ED Provider Notes (Signed)
[place] [facility] Provider Note   CSN: [id number] Arrival date & time: 06/05/22  [id number]     History  Chief Complaint  Patient presents with   Sore Throat    Olivia Griffith is a 32 y.o. female with no medical history.  Patient presents to ED for evaluation of sore throat, body aches and chills, ear pain.  Patient states that 2 days ago she developed a sore throat along with nausea and vomiting secondary to feeling as if she cannot swallow her saliva.  Patient states that yesterday she developed ear pain in both of her ears, states that ear pain came on in both ears at the same time.  Patient reports history of double ear infections.  Denies history of diabetes.  Patient goes on to states that ever since her sore throat began 2 days ago, it has progressively worsened.  Patient states that over-the-counter medication such as ibuprofen are not helping.  Patient states last dose of ibuprofen was 8 AM this morning.  Patient denies fevers, drooling, abdominal pain, diarrhea, chest pain, shortness of breath.   Sore Throat       Home Medications Prior to Admission medications   Medication Sig Start Date End Date Taking? Authorizing Provider  amoxicillin (AMOXIL) 500 MG capsule Take 2 capsules (1,000 mg total) by mouth 2 (two) times daily. 06/05/22  Yes Azucena Cecil, PA-C  predniSONE (DELTASONE) 20 MG tablet Take 2 tablets (40 mg total) by mouth daily. 06/05/22  Yes Azucena Cecil, PA-C  ibuprofen (ADVIL) 600 MG tablet Take 1 tablet (600 mg total) by mouth every 6 (six) hours. 05/05/19   Meisinger, Sherren Mocha, MD  Prenatal Vit-Fe Fumarate-FA (M-NATAL PLUS) 27-1 MG TABS TAKE 1 TABLET BY MOUTH DAILY Patient taking differently: Take 1 tablet by mouth daily.  09/15/18   Seabron Spates, CNM      Allergies    Demerol [meperidine], Demerol [meperidine hcl], and Morphine and related    Review of Systems   Review of Systems  Constitutional:  Positive  for chills.  HENT:  Positive for ear pain and sore throat.   Gastrointestinal:  Positive for nausea and vomiting.  Musculoskeletal:  Positive for myalgias.  All other systems reviewed and are negative.   Physical Exam Updated Vital Signs BP 129/78 (BP Location: Right Arm)   Pulse 76   Temp 98.3 F (36.8 C) (Oral)   Resp 20   Ht '5\' 7"'$  (1.702 m)   Wt (!) 155.1 kg   LMP 06/04/2022 (Exact Date)   SpO2 97%   BMI 53.56 kg/m  Physical Exam Vitals and nursing note reviewed.  Constitutional:      General: She is not in acute distress.    Appearance: Normal appearance. She is not ill-appearing, toxic-appearing or diaphoretic.  HENT:     Head: Normocephalic and atraumatic.     Right Ear: Tympanic membrane is bulging.     Left Ear: Tympanic membrane is bulging.     Nose: Nose normal. No congestion.     Mouth/Throat:     Mouth: Mucous membranes are moist.     Pharynx: Oropharyngeal exudate and posterior oropharyngeal erythema present.     Comments: 2+ tonsillar swelling bilaterally, uvula midline Eyes:     Extraocular Movements: Extraocular movements intact.     Conjunctiva/sclera: Conjunctivae normal.     Pupils: Pupils are equal, round, and reactive to light.  Cardiovascular:     Rate and Rhythm: Normal rate and  regular rhythm.  Pulmonary:     Effort: Pulmonary effort is normal.     Breath sounds: Normal breath sounds. No wheezing.  Abdominal:     General: Abdomen is flat.     Tenderness: There is no abdominal tenderness.  Musculoskeletal:     Cervical back: Normal range of motion and neck supple. No tenderness.  Skin:    General: Skin is warm and dry.     Capillary Refill: Capillary refill takes less than 2 seconds.  Neurological:     Mental Status: She is alert and oriented to person, place, and time.     ED Results / Procedures / Treatments   Labs (all labs ordered are listed, but only abnormal results are displayed) Labs Reviewed  GROUP A STREP BY PCR - Abnormal;  Notable for the following components:      Result Value   Group A Strep by PCR DETECTED (*)    All other components within normal limits  RESP PANEL BY RT-PCR (RSV, FLU A&B, COVID)  [id number]    EKG None  Radiology No results found.  Procedures Procedures   Medications Ordered in ED Medications  dexamethasone (DECADRON) injection 10 mg (10 mg Intramuscular Given 06/05/22 0956)  lidocaine (XYLOCAINE) 2 % viscous mouth solution 15 mL (15 mLs Mouth/Throat Given 06/05/22 0955)     Medical Decision Making Risk Prescription drug management.   32 year old female presents to the ED for evaluation of sore throat.  Please see HPI for further details.  On examination the patient is afebrile, nontachycardic.  Lung sounds clear bilaterally, not hypoxic.  Abdomen soft and compressible throughout.  Neurological examination at baseline.  Posterior oropharynx is erythematous with exudate.  Patient uvula is midline, she is not drooling, there is no change in phonation, no sign of RPA or PTA.  Patient bilateral TMs bulging consistent with otitis media.  Patient reports history of double ear infections.  Denies history of diabetes.  Patient group A strep test positive here.  Viral panel negative for all.  Patient provided 10 mg Decadron, viscous lidocaine.  Patient reports that she took ibuprofen prior to arrival at 8 AM so no NSAIDs or Tylenol administered at this time.  Patient will be sent home with 4 days of 40 mg steroids, 7 days of 1000 mg twice daily amoxicillin for strep pharyngitis as well as otitis media bilateral.  Patient case discussed with attending Dr. Tyrone Nine who voices agreement with plan of management.  Patient given return precautions and she voiced understanding.  Patient had all her questions answered to her satisfaction.  Patient stable for discharge.   Final Clinical Impression(s) / ED Diagnoses Final diagnoses:  Strep pharyngitis  Acute otitis media, unspecified otitis media type     Rx / DC Orders ED Discharge Orders          Ordered    predniSONE (DELTASONE) 20 MG tablet  Every 24 hours        06/05/22 1103    amoxicillin (AMOXIL) 500 MG capsule  2 times daily        06/05/22 1103              Azucena Cecil, PA-C 06/05/22 [place], Dan, DO 06/05/22 1143

## 2022-10-11 ENCOUNTER — Encounter: Payer: Medicaid Other | Admitting: Obstetrics and Gynecology
